# Patient Record
Sex: Female | Born: 1975 | Race: White | Hispanic: No | Marital: Married | Smoking: Former smoker
Health system: Southern US, Community
[De-identification: ages and names within clinical notes are randomized; demographics above are authoritative.]

## PROBLEM LIST (undated history)

## (undated) DIAGNOSIS — R29898 Other symptoms and signs involving the musculoskeletal system: Secondary | ICD-10-CM

## (undated) DIAGNOSIS — F319 Bipolar disorder, unspecified: Secondary | ICD-10-CM

## (undated) DIAGNOSIS — F32A Depression, unspecified: Secondary | ICD-10-CM

## (undated) HISTORY — DX: Bipolar disorder, unspecified: F31.9

## (undated) HISTORY — PX: OVARIAN CYST REMOVAL: SHX89

## (undated) HISTORY — PX: CHOLECYSTECTOMY: SHX55

## (undated) HISTORY — PX: GALLBLADDER SURGERY: SHX652

## (undated) HISTORY — DX: Depression, unspecified: F32.A

## (undated) HISTORY — PX: TUBAL LIGATION: SHX77

## (undated) HISTORY — PX: ABDOMINAL HYSTERECTOMY: SHX81

## (undated) HISTORY — PX: PARTIAL HYSTERECTOMY: SHX80

---

## 2005-04-24 ENCOUNTER — Ambulatory Visit: Payer: Self-pay | Admitting: Psychiatry

## 2005-04-24 ENCOUNTER — Inpatient Hospital Stay (HOSPITAL_COMMUNITY): Admission: EM | Admit: 2005-04-24 | Discharge: 2005-05-01 | Payer: Self-pay | Admitting: Psychiatry

## 2005-06-12 ENCOUNTER — Inpatient Hospital Stay (HOSPITAL_COMMUNITY): Admission: RE | Admit: 2005-06-12 | Discharge: 2005-06-16 | Payer: Self-pay | Admitting: Psychiatry

## 2005-08-21 HISTORY — PX: CARPAL TUNNEL RELEASE: SHX101

## 2008-05-19 ENCOUNTER — Ambulatory Visit (HOSPITAL_COMMUNITY): Payer: Self-pay | Admitting: Psychiatry

## 2008-06-02 ENCOUNTER — Ambulatory Visit (HOSPITAL_COMMUNITY): Payer: Self-pay | Admitting: Psychiatry

## 2011-01-06 NOTE — H&P (Signed)
NAMEJASMIA, [name]                   ACCOUNT NO.:  [id number]   MEDICAL RECORD NO.:  [id number]          PATIENT TYPE:  IPS   LOCATION:  0300                          FACILITY:  [facility]   PHYSICIAN:  Jeanice Lim, M.D. DATE OF BIRTH:  31-Dec-1975   DATE OF ADMISSION:  04/24/2005  DATE OF DISCHARGE:                         PSYCHIATRIC ADMISSION ASSESSMENT   IDENTIFYING INFORMATION:  A 35 year old separated white female,  involuntarily committed on April 24, 2005.   HISTORY OF PRESENT ILLNESS:  The patient is here for intentional overdose on  her pain pills.  She states they were not her medication.  She took 9 pills  in all.  The patient was driving in her car on Sunday feeling very depressed  and wanted it all to stop.  The patient went ahead and called the mental  health services.  She had gone to her husband's home, had seen her children  and then called the police.  She states she had been smoking crack cocaine  and marijuana, with her last use on Thursday prior to this admission.  She  has been sleeping only about 5 hours at night.  Appetite has been decreased  with no apparent weight loss.  She states she was also experiencing auditory  hallucinations the night prior to this admission.  The patient reports that  she is in the process of getting divorced and it was her wedding  anniversary.   PAST PSYCHIATRIC HISTORY:  First admission to [facility].  The  patient states she cut her wrist in March of 2006 and was in [place] for 24  hours, in an outpatient at [facility].   SOCIAL HISTORY:  She is a 35 year old separated white female, first  marriage, married for 12 years, has 4 children that are currently with the  patient's mother.  Her children are ages 64, 80, 68 and 21.  She is currently  living with her husband and is unemployed.   FAMILY HISTORY:  Grandfather had a nervous breakdown.   ALCOHOL DRUG HISTORY:  The patient smokes.  She denies  any alcohol use.  Has  been using crack cocaine and marijuana.   PAST MEDICAL HISTORY:  Primary care Kendarrius Tanzi is Dr. Sherril Croon in [place].  Medical  problems:  The patient reports none.   MEDICATIONS:  Has been on some Prevacid, taking Xanax 1/2 in the morning and  1 mg at bedtime and has been on Wellbutrin but has been off medicines for  the past couple of months.   DRUG ALLERGIES:  No known allergies.   PHYSICAL EXAMINATION:  The patient was assessed at [facility].  This  is an overweight female in no acute distress. Her temperature is 97.6, 70  heart rate, 22 respirations, blood pressure 128/70.  The patient also has  some healed scars to her left wrist and multiple tattoos.   LABORATORY DATA:  CBC is within normal limits.  Urine drug screen is  positive for THC, positive for cocaine, positive for benzodiazepines,  positive for opiates.  Urine pregnancy test was negative.  The  patient had a  normal EKG.  Her Tylenol level was less than 10.  Salicylate level was less  than 4.  Blood sugar is elevated at 127, potassium is 3.1.   MENTAL STATUS EXAM:  She is a fully alert female, cooperative, little eye  contact, casually dressed.  Speech is clear, normal pace and tone.  The  patient feels depressed.  The patient is somewhat flat.  Thought processes:  Endorsing positive auditory hallucinations, although does not appear to be  responding to internal stimuli at this time.  Suicidal thoughts.  Cognitive  function intact.  Memory is good, judgment is poor, insight is minimal.  Her  concentration is intact, average intelligence.   ADMISSION DIAGNOSES:  AXIS I:  Major depressive disorder, rule out bipolar  disorder not otherwise specified, polysubstance abuse.  AXIS II:  Deferred.  AXIS III:  None.  AXIS IV:  Problems with primary support group, other psychosocial problems.  AXIS V:  Current is 35, estimated this past year is 71.   PLAN:  Plan is to stabilize mood and thinking, work on  relapse prevention.  Will initiate the Librium protocol, monitor withdrawal symptoms.  The  patient is to increase coping skills.  Case manager is to look at any  potential rehab programs if the patient is agreeable.  We will initiate  Seroquel for anxiety, consider a mood stabilizer.  The patient is to follow  up with mental health and to be medication compliant.      Landry Corporal, N.P.      Jeanice Lim, M.D.  Electronically Signed    JO/MEDQ  D:  04/28/2005  T:  04/28/2005  Job:  [id number]
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
## 2011-01-06 NOTE — Discharge Summary (Signed)
NAMESHADDAI, SHAPLEY                   ACCOUNT NO.:  [id number]   MEDICAL RECORD NO.:  [id number]          PATIENT TYPE:  IPS   LOCATION:  0300                          FACILITY:  [facility]   PHYSICIAN:  Jeanice Lim, M.D. DATE OF BIRTH:  October 24, 1975   DATE OF ADMISSION:  04/24/2005  DATE OF DISCHARGE:  05/01/2005                                 DISCHARGE SUMMARY   IDENTIFYING DATA:  This is a 35 year old separated Caucasian female  involuntarily committed on April 24, 2005.  Here after intentional  overdose on pain pills.  States they were not her medication.  Took 9 pills  in all.  Was driving in her car on Sunday, feeling very depressed and wanted  to stop it all.  The patient went ahead and called mental health services  and gone to husband's home, seen her children and then called the police.  Had been smoking crack cocaine and marijuana.  Last use on Thursday prior to  this admission.  Had been sleeping only five hours a night.  Appetite  decreased.  Experiencing auditory hallucinations the night prior to  admission.  In the process of getting a divorce and it was her wedding  anniversary.  First admission to [facility].  Had cut her wrist in March of 2006  and was in [place] for 24 hours and an outpatient at [facility].  Denied alcohol use.  Reported using crack cocaine and  marijuana.   MEDICATIONS:  Prevacid, Xanax, Wellbutrin.  Had been off of medicines for a  couple of months.   ALLERGIES:  No known drug allergies.   PHYSICAL EXAMINATION:  Physical and neurologic exam within normal limits.   LABORATORY DATA:  Urine drug screen was positive for THC, cocaine,  benzodiazepines and opiates.  Urine pregnancy test negative.  EKG normal.  Blood sugar 127, potassium slightly low at 3.1.   MENTAL STATUS EXAM:  Fully alert female.  Cooperative.  Little eye contact.  Casually dressed.  Speech within normal limits.  Mood depressed.  Affect  flat.  Thought  processes endorsed positive auditory hallucinations.  Does  not appear to be responding to internal stimulus.  Fleeting suicidal  thoughts.  Cognitively intact.  Judgment and insight were poor to minimal.   ADMISSION DIAGNOSES:  AXIS I:  Major depressive disorder.  Rule out bipolar  disorder not otherwise specified.  Polysubstance abuse.  Possible substance-  induced mood disorder superimposed on bipolar disorder.  AXIS II:  Deferred.  AXIS III:  None.  AXIS IV:  Severe (problems with primary support group, other psychosocial  issues, ending of marriage, questionable legal issues, financial issues and  limited support system).  AXIS V:  35/60.   HOSPITAL COURSE:  The patient was admitted and ordered routine p.r.n.  medications and underwent further monitoring.  Was encouraged to participate  in individual, group and milieu therapy.  Was placed on Librium detox  protocol.  Was instructed on working on developing healthier coping skills.  Casemanager assists in trying to identify appropriate substance abuse  aftercare treatment including  considering residential substance abuse  programs and patient was started on Seroquel for mood instability, Celexa  for depression, Ambien for sleep and Risperdal for mood stabilization and  slight suspiciousness.  Seroquel was decreased.  Twenty-day program was  pursued and patient reported some difficulty sleeping but gradual  stabilization of mood.   CONDITION ON DISCHARGE:  Discharged in improved condition with no dangerous  ideation.  Mood was euthymic.  Judgment and insight improved.  The patient  reported motivation to remain abstinent and be compliant with the aftercare  plan.  Medication education was again reviewed.   DISCHARGE MEDICATIONS:  1.  Prevacid 30 mg, resume taking as previously prescribed.  2.  Symmetrel 100 mg b.i.d.  3.  Ambien 10 mg q.h.s. p.r.n. insomnia.  4.  Seroquel 100 mg, 1-1/2 q.h.s. and 25 mg, 3 tabs three times a  day.  5.  Celexa 1-1/2 q.a.m.  6.  Risperdal 1 mg, 1-1/2 q.h.s.  7.  Neurontin 300 mg, 1 at 9 a.m., 1 p.m. and 2 q.h.s.  8.  Claritin 10 mg q.d. p.r.n. allergies.   FOLLOW UP:  The patient was to follow up with Electa Sniff at [facility] on Thursday, May 04, 2005 at 9 p.m.   DISCHARGE DIAGNOSES:  AXIS I:  Major depressive disorder.  Rule out bipolar  disorder not otherwise specified.  Polysubstance abuse.  Possible substance-  induced mood disorder superimposed on bipolar disorder.  AXIS II:  Deferred.  AXIS III:  None.  AXIS IV:  Severe (problems with primary support group, other psychosocial  issues, ending of marriage, questionable legal issues, financial issues and  limited support system).  AXIS V:  GAF on discharge 55.      Jeanice Lim, M.D.  Electronically Signed     JEM/MEDQ  D:  06/11/2005  T:  06/11/2005  Job:  [id number]

## 2019-11-03 ENCOUNTER — Other Ambulatory Visit (HOSPITAL_COMMUNITY): Payer: Self-pay | Admitting: *Deleted

## 2019-11-03 DIAGNOSIS — Z1231 Encounter for screening mammogram for malignant neoplasm of breast: Secondary | ICD-10-CM

## 2019-11-10 ENCOUNTER — Ambulatory Visit (HOSPITAL_COMMUNITY)
Admission: RE | Admit: 2019-11-10 | Discharge: 2019-11-10 | Disposition: A | Discharge status: Home / Self Care | Payer: Self-pay | Source: Ambulatory Visit | Attending: *Deleted | Admitting: *Deleted

## 2019-11-10 ENCOUNTER — Other Ambulatory Visit: Payer: Self-pay

## 2019-11-10 DIAGNOSIS — Z1231 Encounter for screening mammogram for malignant neoplasm of breast: Secondary | ICD-10-CM | POA: Insufficient documentation

## 2021-05-19 ENCOUNTER — Telehealth: Payer: Self-pay

## 2021-05-19 NOTE — Telephone Encounter (Signed)
Follow up call with patient per completion of [facility] enrollment and eligibility on 9.27.22. to assist with getting first appointment scheduled with her requested PCP with [facility]     Pt was unavailable at time of call will be attempted later in the week, unless pt returns call first.     Text message reminder also sent reminding pt to schedule appointment as soon as possible.

## 2021-05-19 NOTE — Telephone Encounter (Signed)
Pt f/u with [facility]/Clara Gunn on today to get their first medical appointment scheduled with the [facility] per completing  [facility] enrollment and eligibility (05/17/2021)     Chief Medical Complaint(s): Aggravating sensation traveling from elbow to fingertips with feeling of little to no strength to pick up things  Wants to Re-establish medications and care with PCP due to hx of  (Asthma has inhaler currently  Hx of anxiety and depression with past hx of taking meds, but states although she is currently going through some sadness, she states she could use someone to talk to, but does not want to go back on medications  Plan  First appt has been scheduled for (Tues) May 24, 2021 @ 11:00 am  Pt was offered to be connected with a therapist referral by way of our BSW Intern with [employer] [facility].  Pt accepted referral and will be contacted by the week of May 23 2021  Information was reveiwed regarding "arrival time, bringing meds if applicable and cost ($90) one time donation fee for first visit, and cancellation/rescheduling appt policy.

## 2021-05-24 ENCOUNTER — Encounter: Payer: Self-pay | Admitting: Physician Assistant

## 2021-05-24 ENCOUNTER — Ambulatory Visit: Payer: Medicaid Other | Admitting: Physician Assistant

## 2021-05-24 VITALS — BP 119/99 | HR 90 | Temp 97.3°F | Wt 233.0 lb

## 2021-05-24 DIAGNOSIS — Z1239 Encounter for other screening for malignant neoplasm of breast: Secondary | ICD-10-CM

## 2021-05-24 DIAGNOSIS — E669 Obesity, unspecified: Secondary | ICD-10-CM

## 2021-05-24 DIAGNOSIS — Z131 Encounter for screening for diabetes mellitus: Secondary | ICD-10-CM

## 2021-05-24 DIAGNOSIS — R002 Palpitations: Secondary | ICD-10-CM

## 2021-05-24 DIAGNOSIS — Z13220 Encounter for screening for lipoid disorders: Secondary | ICD-10-CM

## 2021-05-24 DIAGNOSIS — M79641 Pain in right hand: Secondary | ICD-10-CM

## 2021-05-24 DIAGNOSIS — F32A Depression, unspecified: Secondary | ICD-10-CM

## 2021-05-24 DIAGNOSIS — R32 Unspecified urinary incontinence: Secondary | ICD-10-CM

## 2021-05-24 DIAGNOSIS — M25522 Pain in left elbow: Secondary | ICD-10-CM

## 2021-05-24 DIAGNOSIS — Z1211 Encounter for screening for malignant neoplasm of colon: Secondary | ICD-10-CM

## 2021-05-24 DIAGNOSIS — Z8249 Family history of ischemic heart disease and other diseases of the circulatory system: Secondary | ICD-10-CM

## 2021-05-24 DIAGNOSIS — Z7689 Persons encountering health services in other specified circumstances: Secondary | ICD-10-CM

## 2021-05-24 NOTE — Patient Instructions (Signed)
Tennis Elbow Tennis elbow (lateral epicondylitis) is inflammation of tendons in your outer forearm, near your elbow. Tendons are tissues that connect muscle to bone. When you have tennis elbow, inflammation affects the tendons that you use to bend your wrist and move your hand up. Inflammation occurs in the lower part of the upper arm bone (humerus), where the tendons connect to the bone (lateral epicondyle). Tennis elbow often affects people who play tennis, but anyone may get the condition from repeatedly extending the wrist or turning the forearm. What are the causes? This condition is usually caused by repeatedly extending the wrist, turning the forearm, and using the hands. It can result from sports or work that requires repetitive forearm movements. In some cases, it may be caused by a sudden injury. What increases the risk? You are more likely to develop tennis elbow if you play tennis or another racket sport. You also have a higher risk if you frequently use your hands for work. Besides people who play tennis, others at greater risk include: People who use computers. Architect workers. People who work in [employer]. Musicians. Cooks. Cashiers. What are the signs or symptoms? Symptoms of this condition include: Pain and tenderness in the forearm and the outer part of the elbow. Pain may be felt only when using the arm, or it may be there all the time. A burning feeling that starts in the elbow and spreads down the forearm. A weak grip in the hand. How is this diagnosed? This condition is diagnosed based on your symptoms, your medical history, and a physical exam. You may also have X-rays or an MRI to: Confirm the diagnosis. Look for other issues. Check for tears in the ligaments, muscles, or tendons. How is this treated? Resting and icing your arm is often the first treatment. Your health care provider may also recommend: Medicines to reduce pain and inflammation. These may be in  the form of a pill, topical gels, or shots of a steroid medicine (cortisone). An elbow strap to reduce stress on the area. Physical therapy. This may include massage or exercises or both. An elbow brace to restrict the movements that cause symptoms. If these treatments do not help relieve your symptoms, your health care provider may recommend surgery to remove damaged muscle and reattach healthy muscle to bone. Follow these instructions at home: If you have a brace or strap: Wear the brace or strap as told by your health care provider. Remove it only as told by your health care provider. Check the skin around the brace or strap every day. Tell your health care provider about any concerns. Loosen the brace if your fingers tingle, become numb, or turn cold and blue. Keep the brace clean. If the brace or strap is not waterproof: Do not let it get wet. Cover it with a watertight covering when you take a bath or a shower. Managing pain, stiffness, and swelling  If directed, put ice on the injured area. To do this: If you have a removable brace or strap, remove it as told by your health care provider. Put ice in a plastic bag. Place a towel between your skin and the bag. Leave the ice on for 20 minutes, 2-3 times a day. Remove the ice if your skin turns bright red. This is very important. If you cannot feel pain, heat, or cold, you have a greater risk of damage to the area. Move your fingers often to reduce stiffness and swelling. Activity Rest your elbow and  wrist and avoid activities that cause symptoms as told by your health care provider. Do physical therapy exercises as told by your health care provider. If you lift an object, lift it with your palm facing up. This reduces stress on your elbow. Lifestyle If your tennis elbow is caused by sports, check your equipment and make sure that: You use it correctly. It is good match for you. If your tennis elbow is caused by work or computer  use, take frequent breaks to stretch your arm. Talk with your employer about ways to manage your condition at work. General instructions Take over-the-counter and prescription medicines only as told by your health care provider. Do not use any products that contain nicotine or tobacco. These products include cigarettes, chewing tobacco, and vaping devices, such as e-cigarettes. If you need help quitting, ask your health care provider. Keep all follow-up visits. This is important. How is this prevented? Before and after activity: Warm up and stretch before being active. Cool down and stretch after being active. Give your body time to rest between periods of activity. During activity: Make sure to use equipment that fits you. If you play tennis, put power in your stroke with your lower body. Avoid using your arm only. Maintain physical fitness, including: Strength. Flexibility. Endurance. Do exercises to strengthen the forearm muscles. Contact a health care provider if: You have pain that gets worse or does not get better with treatment. You have numbness or weakness in your forearm, hand, or fingers. Get help right away if: Your pain is severe. You cannot move your wrist. Summary Tennis elbow (lateral epicondylitis) is inflammation of tendons in your outer forearm, near your elbow. Common symptoms include pain and tenderness in your forearm and the outer part of your elbow. This condition is usually caused by repeatedly extending your wrist, turning your forearm, and using your hands. The first treatment is often resting and icing your arm to relieve symptoms. Further treatment may include taking medicine, getting physical therapy, wearing a brace or strap, or having surgery. This information is not intended to replace advice given to you by your health care provider. Make sure you discuss any questions you have with your health care provider. Document Revised: 02/17/2020 Document  Reviewed: 02/17/2020 Elsevier Patient Education  [facility].

## 2021-05-24 NOTE — Progress Notes (Signed)
BP (!) 119/99   Pulse 90   Temp (!) 97.3 F (36.3 C)   Wt 233 lb (105.7 kg)   SpO2 99%    Subjective:    Patient ID: [name], female    DOB: 02-13-1976, 45 y.o.   MRN: [id number]  HPI: [name] is a 45 y.o. female presenting on 05/24/2021 for New Patient (Initial Visit)   HPI  Pt had a negative covid 19 screening questionnaire.  Pt is 14yoF who presents to establish care.    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  She says she was last seen there last year.  She Was taking it for incontenence.  It helps if she takes it multiple times/day but not if she takes it qd and she only remembers to take it qd.    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      She is married and has 4 sons,  ages 16- 84.  She tries to not have depression; she says she doesn't have tme for that.   She has No SI, HI.  She is still doing regular daily activities.    She is having left arm pain.  She thinks it's the carpal tunnel.  her pain is mostly in elbow.  Was supposed to have it carpal tunnel release but didn't get to the left side (she had the right done).      She is right hand dominant.    She doesn't remember the orthopedist that did her carpal tunnel release but says it was in [place].   Last mammogram- 10/2019.    She reports having a "Gurgle in heart and throat"  about 2 week ago- it only lasted just a second or two.  And she said it was Like her heart fluttered      Relevant past medical, surgical, family and social history reviewed and updated as indicated. Interim medical history since our last visit reviewed. Allergies and medications reviewed and updated.   Current Outpatient Medications:    acetaminophen (TYLENOL) 650 MG CR tablet, Take by mouth every 8 (eight) hours as needed  for pain., Disp: , Rfl:    naproxen sodium (ANAPROX) 275 MG tablet, Take 275 mg by mouth 2 (two) times daily with a meal., Disp: , Rfl:    oxybutynin (DITROPAN) 5 MG tablet, Take 5 mg by mouth 3 (three) times daily., Disp: , Rfl:     Review of Systems  Per HPI unless specifically indicated above     Objective:    BP (!) 119/99   Pulse 90   Temp (!) 97.3 F (36.3 C)   Wt 233 lb (105.7 kg)   SpO2 99%   Wt Readings from Last 3 Encounters:  05/24/21 233 lb (105.7 kg)    Physical Exam Vitals reviewed.  Constitutional:      General: She is not in acute distress.    Appearance: She is well-developed. She is obese. She is not ill-appearing.  HENT:     Head: Normocephalic and atraumatic.     Right Ear: Tympanic membrane, ear canal and external ear normal.     Left Ear: Tympanic membrane, ear canal and external ear normal.  Eyes:     Extraocular Movements: Extraocular movements intact.     Conjunctiva/sclera: Conjunctivae normal.  Pupils: Pupils are equal, round, and reactive to light.  Neck:     Thyroid: No thyromegaly.  Cardiovascular:     Rate and Rhythm: Normal rate and regular rhythm.  Pulmonary:     Effort: Pulmonary effort is normal.     Breath sounds: Normal breath sounds.  Abdominal:     General: Bowel sounds are normal.     Palpations: Abdomen is soft. There is no mass.     Tenderness: There is no abdominal tenderness.  Musculoskeletal:     Left elbow: No swelling. Normal range of motion. Tenderness present in lateral epicondyle.     Left forearm: Normal.     Left wrist: No swelling or tenderness. Normal range of motion. Normal pulse.     Right hand: Tenderness present. No bony tenderness. Normal range of motion. Normal pulse.     Cervical back: Neck supple.     Right lower leg: No edema.     Left lower leg: No edema.     Comments: Negative Tinel and Phalen on the left Tenderness right hand, palmar surface over 3rd mcp  Lymphadenopathy:     Cervical: No  cervical adenopathy.  Skin:    General: Skin is warm and dry.  Neurological:     Mental Status: She is alert and oriented to person, place, and time.     Motor: No weakness or tremor.     Gait: Gait normal.     Deep Tendon Reflexes:     Reflex Scores:      Patellar reflexes are 2+ on the right side and 2+ on the left side. Psychiatric:        Attention and Perception: Attention normal.        Behavior: Behavior is cooperative.    EKG- NSR without st-t changes.  No previous for comparison   No results found for this or any previous visit.      Assessment & Plan:   Encounter Diagnoses  Name Primary?   Encounter to establish care Yes   Screening cholesterol level    Screening for diabetes mellitus    Encounter for screening for malignant neoplasm of breast, unspecified screening modality    Screening for colon cancer    Family history of early CAD    Depression, unspecified depression type    Left elbow pain    Right hand pain    Obesity, unspecified classification, unspecified obesity type, unspecified whether serious comorbidity present    Fluttering sensation of heart    Urinary incontinence, unspecified type      HCM: -refer for screening mammogram -pt ws given FIT test for colon cancer screening -will get Baseline labs with UA -pt is educated and encouraged to get covid and influenza vaccinations.  She was offered appointment to get these but declined  Elbow / hand pain -will Refer to orthopedics for the left elbow and right hand.  She is encouraged to use ice to the elbow for 10-20 minutes, 3 or 4 times daily.  She has a elbow band.    Discussed that her left elbow pain may be due to epicondlyitis and not carpal tunnel as she has been suspecting.  She is given applicatio for [employer] financial assistance  MH -pt encouraged to go to [facility] for counseling  Incontinence - will check UA prior to making any medication changes   Pt will follow up 1 month  with virtual appointment.  She is to contact office sooner prn

## 2021-05-25 ENCOUNTER — Telehealth: Payer: Self-pay

## 2021-05-25 ENCOUNTER — Encounter: Payer: Self-pay | Admitting: Physician Assistant

## 2021-05-25 ENCOUNTER — Other Ambulatory Visit: Payer: Self-pay | Admitting: Physician Assistant

## 2021-05-25 NOTE — Telephone Encounter (Signed)
Called to follow up with [facility] client after her [facility] appointment.  She states all went well and she is being referred to orthopedist ( see referral in [place])  Discussed orders by provider for labs to be drawn at [facility] and that those are to be fasting. Client reports understanding.  Discussed also provider's recommendation for counseling. She states provider gave her a card about [facility]. Discussed with client regarding [facility]'s walk in intake days and times as well as location in [place] on [address].  Client reports understanding.  [facility] [employer]: client reports she thinks she has already filled that out, will ask [place] or D. Boyd with care connect to check and follow up if needed .   Discussed that [place] now has Social work Health visitor and received verbal permission for them to follow up by phone to assure connection with counseling and other possible needs. Client agreeable.  [place] [employer]

## 2021-05-31 ENCOUNTER — Other Ambulatory Visit: Payer: Self-pay

## 2021-05-31 ENCOUNTER — Other Ambulatory Visit (HOSPITAL_COMMUNITY)
Admission: RE | Admit: 2021-05-31 | Discharge: 2021-05-31 | Disposition: A | Discharge status: Home / Self Care | Payer: Medicaid Other | Source: Ambulatory Visit | Attending: Physician Assistant | Admitting: Physician Assistant

## 2021-05-31 ENCOUNTER — Other Ambulatory Visit (HOSPITAL_COMMUNITY): Payer: Self-pay | Admitting: Physician Assistant

## 2021-05-31 DIAGNOSIS — Z1231 Encounter for screening mammogram for malignant neoplasm of breast: Secondary | ICD-10-CM

## 2021-05-31 DIAGNOSIS — Z13220 Encounter for screening for lipoid disorders: Secondary | ICD-10-CM | POA: Insufficient documentation

## 2021-05-31 DIAGNOSIS — E669 Obesity, unspecified: Secondary | ICD-10-CM | POA: Insufficient documentation

## 2021-05-31 DIAGNOSIS — Z131 Encounter for screening for diabetes mellitus: Secondary | ICD-10-CM | POA: Insufficient documentation

## 2021-05-31 DIAGNOSIS — F32A Depression, unspecified: Secondary | ICD-10-CM | POA: Insufficient documentation

## 2021-05-31 DIAGNOSIS — R32 Unspecified urinary incontinence: Secondary | ICD-10-CM | POA: Insufficient documentation

## 2021-05-31 LAB — URINALYSIS, DIPSTICK ONLY
Bilirubin Urine: NEGATIVE
Glucose, UA: NEGATIVE mg/dL
Ketones, ur: NEGATIVE mg/dL
Leukocytes,Ua: NEGATIVE
Nitrite: NEGATIVE
Protein, ur: NEGATIVE mg/dL
Specific Gravity, Urine: 1.021 (ref 1.005–1.030)
pH: 5 (ref 5.0–8.0)

## 2021-05-31 LAB — COMPREHENSIVE METABOLIC PANEL
ALT: 28 U/L (ref 0–44)
AST: 16 U/L (ref 15–41)
Albumin: 4 g/dL (ref 3.5–5.0)
Alkaline Phosphatase: 62 U/L (ref 38–126)
Anion gap: 5 (ref 5–15)
BUN: 17 mg/dL (ref 6–20)
CO2: 24 mmol/L (ref 22–32)
Calcium: 8.3 mg/dL — ABNORMAL LOW (ref 8.9–10.3)
Chloride: 107 mmol/L (ref 98–111)
Creatinine, Ser: 0.58 mg/dL (ref 0.44–1.00)
GFR, Estimated: >60 mL/min (ref >60)
Glucose, Bld: 105 mg/dL — ABNORMAL HIGH (ref 70–99)
Potassium: 4.2 mmol/L (ref 3.5–5.1)
Sodium: 136 mmol/L (ref 135–145)
Total Bilirubin: 0.4 mg/dL (ref 0.3–1.2)
Total Protein: 6.3 g/dL — ABNORMAL LOW (ref 6.5–8.1)

## 2021-05-31 LAB — TSH: TSH: 1.923 u[IU]/mL (ref 0.350–4.500)

## 2021-05-31 LAB — LIPID PANEL
Cholesterol: 223 mg/dL — ABNORMAL HIGH (ref 0–200)
HDL: 38 mg/dL — ABNORMAL LOW (ref >40)
LDL Cholesterol: 139 mg/dL — ABNORMAL HIGH (ref 0–99)
Total CHOL/HDL Ratio: 5.9 RATIO
Triglycerides: 232 mg/dL — ABNORMAL HIGH (ref <150)
VLDL: 46 mg/dL — ABNORMAL HIGH (ref 0–40)

## 2021-05-31 LAB — HEMOGLOBIN A1C
Hgb A1c MFr Bld: 5.2 % (ref 4.8–5.6)
Mean Plasma Glucose: 102.54 mg/dL

## 2021-06-10 ENCOUNTER — Ambulatory Visit (HOSPITAL_COMMUNITY): Payer: Self-pay

## 2021-06-13 ENCOUNTER — Ambulatory Visit (HOSPITAL_COMMUNITY): Payer: Self-pay

## 2021-06-15 ENCOUNTER — Other Ambulatory Visit (HOSPITAL_COMMUNITY): Payer: Self-pay | Admitting: Physician Assistant

## 2021-06-15 ENCOUNTER — Ambulatory Visit (HOSPITAL_COMMUNITY)
Admission: RE | Admit: 2021-06-15 | Discharge: 2021-06-15 | Disposition: A | Discharge status: Home / Self Care | Payer: Self-pay | Source: Ambulatory Visit | Attending: Physician Assistant | Admitting: Physician Assistant

## 2021-06-15 ENCOUNTER — Encounter (HOSPITAL_COMMUNITY): Payer: Self-pay

## 2021-06-15 ENCOUNTER — Other Ambulatory Visit: Payer: Self-pay

## 2021-06-15 DIAGNOSIS — N6342 Unspecified lump in left breast, subareolar: Secondary | ICD-10-CM

## 2021-06-15 DIAGNOSIS — Z1231 Encounter for screening mammogram for malignant neoplasm of breast: Secondary | ICD-10-CM | POA: Insufficient documentation

## 2021-06-16 ENCOUNTER — Ambulatory Visit: Payer: Medicaid Other | Admitting: Physician Assistant

## 2021-06-16 ENCOUNTER — Encounter: Payer: Self-pay | Admitting: Physician Assistant

## 2021-06-16 DIAGNOSIS — F32A Depression, unspecified: Secondary | ICD-10-CM

## 2021-06-16 DIAGNOSIS — N63 Unspecified lump in unspecified breast: Secondary | ICD-10-CM

## 2021-06-16 DIAGNOSIS — N3281 Overactive bladder: Secondary | ICD-10-CM

## 2021-06-16 DIAGNOSIS — E785 Hyperlipidemia, unspecified: Secondary | ICD-10-CM

## 2021-06-16 MED ORDER — FESOTERODINE FUMARATE ER 4 MG PO TB24: 4.0000 mg | ORAL_TABLET | Freq: Every day | ORAL | 1 refills | Status: DC

## 2021-06-16 NOTE — Progress Notes (Signed)
There were no vitals taken for this visit.   Subjective:    Patient ID: [name], female    DOB: [birth year], 45 y.o.   MRN: [id number]  HPI: [name] is a 45 y.o. female presenting on 06/16/2021 for No chief complaint on file.   HPI  This is a telemedicine appointment through [employer]  I connected with  [name] on 06/16/21 by a video enabled telemedicine application and verified that I am speaking with the correct person using two identifiers.   I discussed the limitations of evaluation and management by telemedicine. The patient expressed understanding and agreed to proceed.  Pt is at home.  Provider is at office.    Pt is 23yoF who presents to follow up for new patient appointment earlier this month.  Pt went for mammogram yesterday but told them she had a lump so they wouldn't do the screening mammogram.  She says she had a bump that busted and drained and went away on the skin but left a left knot underneath the skiin.  She says it's like a Marble x 1 week that isn't going away.  Pt called office late yesterday reporting problems with Room is spinning violently and feeling like she was going to vomit.  She says it Happened 2 nights in a row.  She says it started after starting voltaren gel.  She didn't use the gel yesterday and she didn't have the spinning last night.  She is feeling well today.  Pt is s/p hysterectomy.  UA shows small blood.  She used to take ditropan for her incontinence but had troube remembering to take the medication 4 times every day .  She has been having the incontenence for a long long time, maybe ten years.   It started after childbirth- she has 4 children.  She was 15yo with her first.        Relevant past medical, surgical, family and social history reviewed and updated as indicated. Interim medical history since our last visit reviewed. Allergies and medications reviewed and updated.   Current Outpatient Medications:     naproxen sodium (ANAPROX) 275 MG tablet, Take 275 mg by mouth 2 (two) times daily with a meal., Disp: , Rfl:    acetaminophen (TYLENOL) 650 MG CR tablet, Take by mouth every 8 (eight) hours as needed for pain. (Patient not taking: Reported on 06/16/2021), Disp: , Rfl:    oxybutynin (DITROPAN) 5 MG tablet, Take 5 mg by mouth 3 (three) times daily. (Patient not taking: Reported on 06/16/2021), Disp: , Rfl:      Review of Systems  Per HPI unless specifically indicated above     Objective:    There were no vitals taken for this visit.  Wt Readings from Last 3 Encounters:  05/24/21 233 lb (105.7 kg)    Physical Exam Constitutional:      General: She is not in acute distress.    Appearance: She is not toxic-appearing.  HENT:     Head: Normocephalic and atraumatic.  Pulmonary:     Effort: Pulmonary effort is normal. No respiratory distress.     Comments: Pt is speaking in complete sentences without dyspnea. Neurological:     Mental Status: She is alert and oriented to person, place, and time.  Psychiatric:        Attention and Perception: Attention normal.        Speech: Speech normal.    Results for orders placed or performed during  the hospital encounter of 05/31/21  TSH  Result Value Ref Range   TSH 1.923 0.350 - 4.500 uIU/mL  Hemoglobin A1c  Result Value Ref Range   Hgb A1c MFr Bld 5.2 4.8 - 5.6 %   Mean Plasma Glucose 102.54 mg/dL  Lipid panel  Result Value Ref Range   Cholesterol 223 (H) 0 - 200 mg/dL   Triglycerides 232 (H) <150 mg/dL   HDL 38 (L) >40 mg/dL   Total CHOL/HDL Ratio 5.9 RATIO   VLDL 46 (H) 0 - 40 mg/dL   LDL Cholesterol 139 (H) 0 - 99 mg/dL  Comprehensive metabolic panel  Result Value Ref Range   Sodium 136 135 - 145 mmol/L   Potassium 4.2 3.5 - 5.1 mmol/L   Chloride 107 98 - 111 mmol/L   CO2 24 22 - 32 mmol/L   Glucose, Bld 105 (H) 70 - 99 mg/dL   BUN 17 6 - 20 mg/dL   Creatinine, Ser 0.58 0.44 - 1.00 mg/dL   Calcium 8.3 (L) 8.9 - 10.3  mg/dL   Total Protein 6.3 (L) 6.5 - 8.1 g/dL   Albumin 4.0 3.5 - 5.0 g/dL   AST 16 15 - 41 U/L   ALT 28 0 - 44 U/L   Alkaline Phosphatase 62 38 - 126 U/L   Total Bilirubin 0.4 0.3 - 1.2 mg/dL   GFR, Estimated >60 >60 mL/min   Anion gap 5 5 - 15  Urinalysis, dipstick only  Result Value Ref Range   Color, Urine YELLOW YELLOW   APPearance HAZY (A) CLEAR   Specific Gravity, Urine 1.021 1.005 - 1.030   pH 5.0 5.0 - 8.0   Glucose, UA NEGATIVE NEGATIVE mg/dL   Hgb urine dipstick SMALL (A) NEGATIVE   Bilirubin Urine NEGATIVE NEGATIVE   Ketones, ur NEGATIVE NEGATIVE mg/dL   Protein, ur NEGATIVE NEGATIVE mg/dL   Nitrite NEGATIVE NEGATIVE   Leukocytes,Ua NEGATIVE NEGATIVE      Assessment & Plan:    Encounter Diagnoses  Name Primary?   OAB (overactive bladder) Yes   Mass of breast, unspecified laterality    Hyperlipidemia, unspecified hyperlipidemia type    Depression, unspecified depression type      -reviewed labs with pt -Reordered mammogram as diagnostic -counseled pt on Lowfat diet for dyslipidemia.  Mailed reading information to her -trial of Toviaz for OAB -discussed Counseling and pt says she would be interested.  Will have counselor contact her -educated and encouraged pt to get Covid vaccination -pt will continue to avoid voltaren gel due to her getting to feeling badly when using it -pt to follow up 3 months.  She is to contact office sooner prn

## 2021-06-16 NOTE — Patient Instructions (Signed)

## 2021-06-21 ENCOUNTER — Ambulatory Visit: Payer: Medicaid Other | Admitting: Physician Assistant

## 2021-06-21 ENCOUNTER — Telehealth: Payer: Self-pay | Admitting: Licensed Clinical Social Worker

## 2021-06-21 NOTE — Telephone Encounter (Signed)
[facility] reached patient via phone call, patient confirmed interest in counseling services, first appointment was scheduled for 07/05/21 at 9 am.

## 2021-06-28 ENCOUNTER — Telehealth: Payer: Self-pay | Admitting: Licensed Clinical Social Worker

## 2021-06-28 NOTE — Telephone Encounter (Signed)
[facility] reached patient via phone call regarding rescheduling counseling appointment, patient asked [facility] to call again next week, so that she could have access to her schedule by then, [facility] agreed.

## 2021-07-05 ENCOUNTER — Ambulatory Visit: Payer: Medicaid Other | Admitting: Licensed Clinical Social Worker

## 2021-07-05 ENCOUNTER — Telehealth: Payer: Self-pay | Admitting: Licensed Clinical Social Worker

## 2021-07-05 NOTE — Telephone Encounter (Signed)
[facility] reached patient and first counseling appointment was scheduled for 9 am on 08/09/21.

## 2021-07-12 ENCOUNTER — Telehealth: Payer: Self-pay

## 2021-07-12 NOTE — Telephone Encounter (Signed)
Call from pt regarding rx from [facility] not received, informed I would call to check with pharmacy & pharmacy # given.  Called [facility] & was informed Rx not ordered & order now placed, medication to be received within the next 2 weeks.  Called pt back to inform of medication to be delivered, left vm to call back as there was no answer

## 2021-07-26 ENCOUNTER — Ambulatory Visit (HOSPITAL_COMMUNITY): Payer: Self-pay

## 2021-07-26 ENCOUNTER — Ambulatory Visit (HOSPITAL_COMMUNITY): Payer: Medicaid Other

## 2021-07-26 ENCOUNTER — Encounter (HOSPITAL_COMMUNITY): Payer: Self-pay

## 2021-07-26 ENCOUNTER — Encounter (HOSPITAL_COMMUNITY): Payer: Medicaid Other

## 2021-08-09 ENCOUNTER — Ambulatory Visit: Payer: Medicaid Other | Admitting: Licensed Clinical Social Worker

## 2021-08-09 ENCOUNTER — Telehealth: Payer: Self-pay | Admitting: Licensed Clinical Social Worker

## 2021-08-09 NOTE — Telephone Encounter (Signed)
[facility] attempted to reach patient to reschedule cancelled apt, left voicemail.

## 2021-08-29 ENCOUNTER — Encounter: Payer: Self-pay | Admitting: Orthopaedic Surgery

## 2021-09-07 ENCOUNTER — Other Ambulatory Visit: Payer: Self-pay | Admitting: Physician Assistant

## 2021-09-07 DIAGNOSIS — E785 Hyperlipidemia, unspecified: Secondary | ICD-10-CM

## 2021-09-16 ENCOUNTER — Other Ambulatory Visit (HOSPITAL_COMMUNITY)
Admission: RE | Admit: 2021-09-16 | Discharge: 2021-09-16 | Disposition: A | Discharge status: Home / Self Care | Payer: Medicaid Other | Source: Ambulatory Visit | Attending: Physician Assistant | Admitting: Physician Assistant

## 2021-09-16 ENCOUNTER — Other Ambulatory Visit: Payer: Self-pay

## 2021-09-16 DIAGNOSIS — E785 Hyperlipidemia, unspecified: Secondary | ICD-10-CM | POA: Insufficient documentation

## 2021-09-16 LAB — HEPATIC FUNCTION PANEL
ALT: 27 U/L (ref 0–44)
AST: 16 U/L (ref 15–41)
Albumin: 4.2 g/dL (ref 3.5–5.0)
Alkaline Phosphatase: 73 U/L (ref 38–126)
Bilirubin, Direct: <0.1 mg/dL (ref 0.0–0.2)
Total Bilirubin: 0.3 mg/dL (ref 0.3–1.2)
Total Protein: 7.1 g/dL (ref 6.5–8.1)

## 2021-09-16 LAB — LIPID PANEL
Cholesterol: 212 mg/dL — ABNORMAL HIGH (ref 0–200)
HDL: 39 mg/dL — ABNORMAL LOW (ref >40)
LDL Cholesterol: 128 mg/dL — ABNORMAL HIGH (ref 0–99)
Total CHOL/HDL Ratio: 5.4 RATIO
Triglycerides: 226 mg/dL — ABNORMAL HIGH (ref <150)
VLDL: 45 mg/dL — ABNORMAL HIGH (ref 0–40)

## 2021-09-20 ENCOUNTER — Ambulatory Visit: Payer: Medicaid Other | Admitting: Physician Assistant

## 2021-09-20 ENCOUNTER — Ambulatory Visit (HOSPITAL_COMMUNITY)
Admission: RE | Admit: 2021-09-20 | Discharge: 2021-09-20 | Disposition: A | Discharge status: Home / Self Care | Payer: Self-pay | Source: Ambulatory Visit | Attending: Physician Assistant | Admitting: Physician Assistant

## 2021-09-20 ENCOUNTER — Other Ambulatory Visit: Payer: Self-pay

## 2021-09-20 ENCOUNTER — Encounter: Payer: Self-pay | Admitting: Physician Assistant

## 2021-09-20 VITALS — BP 138/100 | HR 89 | Temp 97.8°F | Ht 61.75 in | Wt 236.0 lb

## 2021-09-20 DIAGNOSIS — N6342 Unspecified lump in left breast, subareolar: Secondary | ICD-10-CM | POA: Insufficient documentation

## 2021-09-20 DIAGNOSIS — N3281 Overactive bladder: Secondary | ICD-10-CM

## 2021-09-20 DIAGNOSIS — F32A Depression, unspecified: Secondary | ICD-10-CM

## 2021-09-20 DIAGNOSIS — E785 Hyperlipidemia, unspecified: Secondary | ICD-10-CM

## 2021-09-20 NOTE — Patient Instructions (Addendum)
COVID-19 vaccination significantly lowers your risk of severe illness, hospitalization, and death if you get infected. Compared to people who are up to date with their COVID-19 vaccinations, unvaccinated people aremore likely to get COVID-19, much more likely to be hospitalized with COVID-19, and much more likely to die from COVID-19. Like all vaccines, COVID-19 vaccines are not 100% effective at preventing infection. Some people who are up to date with their COVID-19 vaccinations will get COVID-19 breakthrough infection. However, staying up to date with your COVID-19 vaccinations means that you are less likely to have a breakthrough infection and, if you do get sick, you are less likely to get severely ill or die. Staying up to date with COVID-19 vaccination also means you are less likely to spread the disease to others and increases your protection against new variants of SARS-CoV-2, the virus that causes COVID-19.

## 2021-09-20 NOTE — Progress Notes (Signed)
BP (!) 138/100    Pulse 89    Temp 97.8 F (36.6 C)    Ht 5' 1.75" (1.568 m)    Wt 236 lb (107 kg)    SpO2 97%    BMI 43.52 kg/m    Subjective:    Patient ID: [name], female    DOB: July 05, 1976, 46 y.o.   MRN: [id number]  HPI: [name] is a 46 y.o. female presenting on 09/20/2021 for Hyperlipidemia and Over Active Bladder   HPI  Chief Complaint  Patient presents with   Hyperlipidemia   Over Active Bladder     Pt is 45yoF with appt today to recheck OAB after startting Toviaz at last appointment.  She is also here to follow up dyslipidemia; she opted to treat with lowfat diet.  She says she hasn't really history MH sub abuse and SI  Pt c/o memory problems and hip pain and sob  Pt was Referred to orthopedics but they closed referral due to they were unable to contact her.  She says the Charlann Boxer helps if she remembers to take it but she hates to take medicine.  She has not yet got covid vacciantion  [facility] is no [facility].  [facility] here called pt multiple times, leaving voicemails, but pt never called back.    Pt says She fell - hip and back hurting now.    She has appointment for Mammogram later today.  She has no SI, HI, or recent drug use  She has not returned FIT test given in October for colon cancer screening.     Relevant past medical, surgical, family and social history reviewed and updated as indicated. Interim medical history since our last visit reviewed. Allergies and medications reviewed and updated.   Current Outpatient Medications:    acetaminophen (TYLENOL) 650 MG CR tablet, Take by mouth every 8 (eight) hours as needed for pain., Disp: , Rfl:    fesoterodine (TOVIAZ) 4 MG TB24 tablet, Take 1 tablet (4 mg total) by mouth daily., Disp: 90 tablet, Rfl: 1   naproxen sodium (ANAPROX) 275 MG tablet, Take 275 mg by mouth 2 (two) times daily with a meal. (Patient not taking: Reported on 09/20/2021), Disp: , Rfl:     Review of  Systems  Per HPI unless specifically indicated above     Objective:    BP (!) 138/100    Pulse 89    Temp 97.8 F (36.6 C)    Ht 5' 1.75" (1.568 m)    Wt 236 lb (107 kg)    SpO2 97%    BMI 43.52 kg/m   Wt Readings from Last 3 Encounters:  09/20/21 236 lb (107 kg)  05/24/21 233 lb (105.7 kg)    Physical Exam Vitals reviewed.  Constitutional:      General: She is not in acute distress.    Appearance: She is well-developed. She is obese. She is not toxic-appearing.  HENT:     Head: Normocephalic and atraumatic.  Cardiovascular:     Rate and Rhythm: Normal rate and regular rhythm.  Pulmonary:     Effort: Pulmonary effort is normal.     Breath sounds: Normal breath sounds.  Abdominal:     General: Bowel sounds are normal.     Palpations: Abdomen is soft. There is no mass.     Tenderness: There is no abdominal tenderness.  Musculoskeletal:     Cervical back: Neck supple.     Right lower  leg: No edema.     Left lower leg: No edema.  Lymphadenopathy:     Cervical: No cervical adenopathy.  Skin:    General: Skin is warm and dry.  Neurological:     Mental Status: She is alert and oriented to person, place, and time.  Psychiatric:        Behavior: Behavior normal.    Results for orders placed or performed during the hospital encounter of 09/16/21  Hepatic function panel  Result Value Ref Range   Total Protein 7.1 6.5 - 8.1 g/dL   Albumin 4.2 3.5 - 5.0 g/dL   AST 16 15 - 41 U/L   ALT 27 0 - 44 U/L   Alkaline Phosphatase 73 38 - 126 U/L   Total Bilirubin 0.3 0.3 - 1.2 mg/dL   Bilirubin, Direct <0.1 0.0 - 0.2 mg/dL   Indirect Bilirubin NOT CALCULATED 0.3 - 0.9 mg/dL  Lipid panel  Result Value Ref Range   Cholesterol 212 (H) 0 - 200 mg/dL   Triglycerides 226 (H) <150 mg/dL   HDL 39 (L) >40 mg/dL   Total CHOL/HDL Ratio 5.4 RATIO   VLDL 45 (H) 0 - 40 mg/dL   LDL Cholesterol 128 (H) 0 - 99 mg/dL      Assessment & Plan:   Encounter Diagnoses  Name Primary?   OAB  (overactive bladder) Yes   Hyperlipidemia, unspecified hyperlipidemia type    Morbid obesity ([place])    Depression, unspecified depression type      -reviewed labs with pt -pt declined recommendation to take fish oil.  She is encouraged to Follow lowfat diet -pt was encouraged to reduce Weight with healthy diet and regular Exercise.  This will help her lipids and her joint pains and her OAB.  -pt was scheduled to see [facility] -educated and encouraged pt to get Covid vaccination -pt to [place].  Discussed with pt that like any mediations, it cannot work if she doesn't take it -pt was told to call orthopedics if she was still interested in going there Pt encouraged to Return FIT test given to her for colon cancer screening -pt to follow up 3 months.  She is to contact office sooner prn

## 2021-09-22 ENCOUNTER — Ambulatory Visit: Payer: Medicaid Other | Admitting: Physician Assistant

## 2021-09-22 ENCOUNTER — Encounter: Payer: Self-pay | Admitting: Physician Assistant

## 2021-09-22 ENCOUNTER — Other Ambulatory Visit: Payer: Self-pay

## 2021-09-22 VITALS — BP 144/90 | HR 91 | Temp 97.1°F | Wt 234.0 lb

## 2021-09-22 DIAGNOSIS — N6002 Solitary cyst of left breast: Secondary | ICD-10-CM

## 2021-09-22 MED ORDER — DICLOXACILLIN SODIUM 250 MG PO CAPS: 250.0000 mg | ORAL_CAPSULE | Freq: Four times a day (QID) | ORAL | 0 refills | Status: DC

## 2021-09-22 NOTE — Progress Notes (Signed)
° °  BP (!) 144/90    Pulse 91    Temp (!) 97.1 F (36.2 C)    Wt 234 lb (106.1 kg)    SpO2 99%    BMI 43.15 kg/m    Subjective:    Patient ID: [name], female    DOB: 1975-11-06, 46 y.o.   MRN: [id number]  HPI: [name] is a 46 y.o. female presenting on 09/22/2021 for Breast Mass   HPI   Chief Complaint  Patient presents with   Breast Mass    Pt had Diagnostic mammogram which showed her mass to be a cyst.  Pt reports it is, at times, red and painful.  She admits to squeezing it, trying to express pus.  There has been no drainage form the cyst of the nipple.     Relevant past medical, surgical, family and social history reviewed and updated as indicated. Interim medical history since our last visit reviewed. Allergies and medications reviewed and updated.  Review of Systems  Per HPI unless specifically indicated above     Objective:    BP (!) 144/90    Pulse 91    Temp (!) 97.1 F (36.2 C)    Wt 234 lb (106.1 kg)    SpO2 99%    BMI 43.15 kg/m   Wt Readings from Last 3 Encounters:  09/22/21 234 lb (106.1 kg)  09/20/21 236 lb (107 kg)  05/24/21 233 lb (105.7 kg)    Physical Exam Constitutional:      General: She is not in acute distress.    Appearance: She is obese. She is not ill-appearing.  Pulmonary:     Effort: Pulmonary effort is normal. No respiratory distress.  Chest:  Breasts:    Left: Mass present. No swelling, bleeding, inverted nipple, nipple discharge or tenderness.     Comments: Small, less than 1 cm, round soft mobile cyst at 9 o'clock position of the left breast.  There is no redness, heat or tenderness. Neurological:     Mental Status: She is alert and oriented to person, place, and time.  Psychiatric:        Attention and Perception: Attention normal.        Speech: Speech normal.        Behavior: Behavior normal. Behavior is cooperative.          Assessment & Plan:   Encounter Diagnosis  Name Primary?   Cyst of left  breast Yes     -Pt was reassured.  Will rx Diclox.  Pt is urged to STOP MASHING her breast.  She can use warm compresses as needed -f/u as scheduled.  RTO sooner for any worsening or changes

## 2021-09-22 NOTE — Patient Instructions (Signed)
Breast Cyst A breast cyst is a sac in the breast that is filled with fluid. They are usually noncancerous (benign) and are common among women. Breast cysts are most often in the upper, outer portion of the breast. One or more cysts may develop. They form when fluid builds up inside the breast glands. There are several types of breast cysts. Some are too small to feel, but these can be seen with imaging tests such as an X-ray of the breast (mammogram) or ultrasound. Breast cysts do not increase your risk of breast cancer. They usually disappear after you no longer have a menstrual cycle (after menopause), unless you take artificial hormones (are on hormone therapy). What are the causes? This condition may be caused by: Blockage of tubes (ducts) in the breast glands, which leads to fluid buildup. Duct blockage may result from: Fibrocystic breast changes. This is a common, benign condition that occurs when women go through hormonal changes during the menstrual cycle. This is a common cause of multiple breast cysts. Overgrowth of breast tissue or breast glands. Scar tissue in the breast from previous surgery. Changes in certain female hormones (estrogen and progesterone). The exact cause of this condition is not known. What increases the risk? You may be more likely to develop breast cysts if you have not gone through menopause. What are the signs or symptoms? Symptoms of this condition include: Feeling one or more smooth, round, soft lumps (like grapes) in the breast that are easily movable. The lump or lumps may get bigger and more painful before your menstrual period and get smaller after your menstrual period. Breast discomfort or pain. How is this diagnosed? This condition may be diagnosed based on: A physical exam. A cyst can be felt by your health care provider during this exam. Imaging tests, such as mammogram or ultrasound. Fluid may be removed from the cyst with a needle (fine-needle  aspiration) and tested to make sure the cyst is not cancerous. How is this treated? Treatment may not be needed for this condition. Your health care provider may monitor the cyst to see if it goes away on its own. If the cyst is uncomfortable or gets bigger, or if you do not like how the cyst makes your breast look, you may need treatment. Treatment may include: Hormone therapy. Fine-needle aspiration to drain fluid from the cyst. There is a chance of the cyst coming back (recurring) after aspiration. Surgery to remove the cyst. Follow these instructions at home: Self-exams  Do a breast self-exam every month, or as often as directed. A breast self-exam involves: Comparing your breasts in the mirror. Looking for visible changes in your skin or nipples. Feeling for lumps or changes. Having many breast cysts may make it harder to feel for new lumps. Understand how your breasts normally look and feel, and write down any changes in your breasts. Tell your health care provider about any changes. Eating and drinking Follow instructions from your health care provider about eating and drinking restrictions. Drink enough fluid to keep your urine pale yellow. Avoid caffeine. Cut down on salt (sodium) in what you eat and drink, especially before your menstrual period. Too much sodium can cause fluid buildup, breast swelling, and discomfort. General instructions See your health care provider regularly. Get a yearly physical exam. If you are 87-48 years old, get a clinical breast exam every 1-3 years. After the age of 61 years, get this exam every year. Get mammograms as often as directed. Take over-the-counter  and prescription medicines only as told by your health care provider. Wear a supportive bra, especially when exercising. Keep all follow-up visits as told by your health care provider. This is important. Contact a health care provider if: You feel, or think you feel, a lump in your breast. You  notice that both breasts look or feel different than usual. Your breast is still causing pain after your menstrual period is over. You find new lumps or bumps that were not there before. You feel lumps in your armpit. Get help right away if: You have severe pain, tenderness, redness, or warmth in your breast. You have fluid or blood leaking from your nipple. Your breast lump becomes hard and painful. You notice dimpling or wrinkling of the breast or nipple. Summary A breast cyst is a sac in the breast that is filled with fluid. Treatment may not be needed for this condition. If the cyst is uncomfortable or gets bigger, or if you do not like how the cyst makes your breast look, you may need treatment. This information is not intended to replace advice given to you by your health care provider. Make sure you discuss any questions you have with your health care provider. Document Revised: 12/24/2018 Document Reviewed: 12/24/2018 Elsevier Patient Education  [place].

## 2021-10-04 ENCOUNTER — Ambulatory Visit: Payer: Medicaid Other | Admitting: Licensed Clinical Social Worker

## 2021-10-04 ENCOUNTER — Other Ambulatory Visit: Payer: Self-pay | Admitting: Physician Assistant

## 2021-10-04 DIAGNOSIS — Z1211 Encounter for screening for malignant neoplasm of colon: Secondary | ICD-10-CM

## 2021-10-04 DIAGNOSIS — F32A Depression, unspecified: Secondary | ICD-10-CM

## 2021-10-04 DIAGNOSIS — F419 Anxiety disorder, unspecified: Secondary | ICD-10-CM

## 2021-10-04 LAB — IFOBT (OCCULT BLOOD): IFOBT: NEGATIVE

## 2021-10-04 NOTE — Progress Notes (Signed)
[facility] engaged patient in initial [facility] session. [facility] provided active listening and validation as patient shared about current symptoms of anxiety and depression, stressors, and past loss/trauma. No SI/HI. Patient's current support systems and coping mechanisms were assessed. Treatment will begin with biweekly sessions to address symptoms.

## 2021-10-12 ENCOUNTER — Telehealth: Payer: Self-pay

## 2021-10-18 ENCOUNTER — Ambulatory Visit: Payer: Medicaid Other | Admitting: Licensed Clinical Social Worker

## 2021-11-01 ENCOUNTER — Ambulatory Visit: Payer: Medicaid Other | Admitting: Licensed Clinical Social Worker

## 2021-12-19 ENCOUNTER — Ambulatory Visit: Payer: Medicaid Other | Admitting: Physician Assistant

## 2021-12-19 ENCOUNTER — Encounter: Payer: Self-pay | Admitting: Physician Assistant

## 2021-12-19 VITALS — BP 137/84 | HR 84 | Temp 97.1°F

## 2021-12-19 DIAGNOSIS — R32 Unspecified urinary incontinence: Secondary | ICD-10-CM

## 2021-12-19 DIAGNOSIS — K921 Melena: Secondary | ICD-10-CM

## 2021-12-19 DIAGNOSIS — F419 Anxiety disorder, unspecified: Secondary | ICD-10-CM

## 2021-12-19 DIAGNOSIS — G8929 Other chronic pain: Secondary | ICD-10-CM

## 2021-12-19 DIAGNOSIS — R200 Anesthesia of skin: Secondary | ICD-10-CM

## 2021-12-19 DIAGNOSIS — Z800 Family history of malignant neoplasm of digestive organs: Secondary | ICD-10-CM

## 2021-12-19 DIAGNOSIS — N6002 Solitary cyst of left breast: Secondary | ICD-10-CM

## 2021-12-19 DIAGNOSIS — N3281 Overactive bladder: Secondary | ICD-10-CM

## 2021-12-19 MED ORDER — OXYBUTYNIN CHLORIDE ER 10 MG PO TB24: 10.0000 mg | ORAL_TABLET | Freq: Every day | ORAL | 1 refills | Status: DC

## 2021-12-19 NOTE — Progress Notes (Signed)
? ?BP 137/84   Pulse 84   Temp (!) 97.1 ?F (36.2 ?C)   SpO2 98%   ? ?Subjective:  ? ? Patient ID: [name], female    DOB: May 17, 1976, 46 y.o.   MRN: [id number] ? ?HPI: ?[name] is a 46 y.o. female presenting on 12/19/2021 for No chief complaint on file. ? ? ?HPI ? ?Pt is 2yoF who presents for recheck OAB and recheck breast cyst. ? ?She Took toviaz since OV 09/20/21 and she was still leaking.   She she says she does not feel an urge, it just comes out.   Sometimes she does know that it is going to come out.   Oxybutynin worked in the past but she stopped it because she couldn't remember to take it 4 times/day.    She does not drink sodas.  She drinks water.  ? ?The breast cyst is still there but it doesn't hurt.   She has stopped mashing on it.   She says it is getting a lot smaller.  She has no nipple discharge.  Diagnostic mammogram was reassuring.  ? ?She has had a lot of blood in her stool.  She says it has been going on for years but never reported it.   It isn't every single time but it is over half the time.  She says it is a lot of blood at times.    She had negative FIT test 10/04/21.  She has history of hemorrhoids and has had to have them lanced in the past.  She has family history of colon cancer.  ? 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  She had same thiing in the right but had surgery on the right elbow about 13 years ago.  She says they wanted to operate on the left elbow as well but she didn't get it done.   In the past it was just from her elbow down but is now all the way up to the shoulder.   ? ?She applied recently for [employer] financial assistance.  She says she kept getting a bill for bloodwork. ? ? ? ?Relevant past medical, surgical, family and social history reviewed and updated as indicated. Interim medical history since our last visit reviewed. ?Allergies and medications reviewed and updated. ? ? ? ?Current Outpatient Medications:  ?  acetaminophen  (TYLENOL) 650 MG CR tablet, Take by mouth every 8 (eight) hours as needed for pain., Disp: , Rfl:  ?  NAPROXEN DR PO, Take by mouth., Disp: , Rfl:  ?  fesoterodine (TOVIAZ) 4 MG TB24 tablet, Take 1 tablet (4 mg total) by mouth daily. (Patient not taking: Reported on 12/19/2021), Disp: 90 tablet, Rfl: 1 ? ? ? ? ?Review of Systems ? ?Per HPI unless specifically indicated above ? ?   ?Objective:  ?  ?BP 137/84   Pulse 84   Temp (!) 97.1 ?F (36.2 ?C)   SpO2 98%   ?Wt Readings from Last 3 Encounters:  ?09/22/21 234 lb (106.1 kg)  ?09/20/21 236 lb (107 kg)  ?05/24/21 233 lb (105.7 kg)  ?  ?Physical Exam ?Vitals reviewed. Exam conducted with a chaperone present.  ?Constitutional:   ?   General: She is not in acute distress. ?   Appearance: She is well-developed. She is obese. She is not toxic-appearing.  ?HENT:  ?   Head: Normocephalic and atraumatic.  ?Cardiovascular:  ?   Rate and Rhythm: Normal rate and regular rhythm.  ?  Pulmonary:  ?   Effort: Pulmonary effort is normal.  ?   Breath sounds: Normal breath sounds.  ?Abdominal:  ?   General: Bowel sounds are normal.  ?   Palpations: Abdomen is soft. There is no mass.  ?   Tenderness: There is no abdominal tenderness.  ?Genitourinary: ?   Rectum: No mass, tenderness or external hemorrhoid.  ?   Comments: Extra skin/small non-thrombosed external hemorrhoids ?Musculoskeletal:  ?   Left shoulder: Tenderness present. No swelling or deformity. Normal range of motion. Normal strength.  ?   Left elbow: Normal.  ?   Left wrist: Normal.  ?   Cervical back: Neck supple.  ?   Right lower leg: No edema.  ?   Left lower leg: No edema.  ?   Comments: Left shoulder tenderness posteriorly at the acromion  ?Lymphadenopathy:  ?   Cervical: No cervical adenopathy.  ?Skin: ?   General: Skin is warm and dry.  ?Neurological:  ?   Mental Status: She is alert and oriented to person, place, and time.  ?Psychiatric:     ?   Behavior: Behavior normal.  ? ? ? ? ? ? ?   ?Assessment & Plan:   ? ?Encounter Diagnoses  ?Name Primary?  ? OAB (overactive bladder) Yes  ? Urinary incontinence, unspecified type   ? Blood present in stool   ? Family history of colon cancer   ? Cyst of left breast   ? Anxiety   ? Chronic left shoulder pain   ? Left arm numbness   ? ? ? ? ? ?OAB/ incontinence ?-Trial of ditropan xl for OAB and incontinence ? ?Blood PR/ family history colon cancer ?-Refer to GI for blood PR and family hx colon cancer.  Discussed that the bleeding may all be due to hemorrhoids but with frequency of the bleeding and family history, referral is indicated.  She agrees ? ?Breast cyst ?-Will continue to monitor breast cyst ? ?HCM ?-pt was Educated and encouraged to get covid vaccination ? ?MH  ?-pt was rescheduled with [facility] ? ? ?Left shoulder/arm ?Will plan to refer to orthopedics if cafa approved ? ? ?-will Check on her [employer] financial assistance application  ?-pt to follow up 4 weeks to recheck OAB.  She is to contact office sooner prn ? ?

## 2021-12-19 NOTE — Patient Instructions (Addendum)
Overactive Bladder, Adult ? ?Overactive bladder is a condition in which a person has a sudden and frequent need to urinate. A person might also leak urine if he or she cannot get to the bathroom fast enough (urinary incontinence). Sometimes, symptoms can interfere with work or social activities. ?What are the causes? ?Overactive bladder is associated with poor nerve signals between your bladder and your brain. Your bladder may get the signal to empty before it is full. You may also have very sensitive muscles that make your bladder squeeze too soon. This condition may also be caused by other factors, such as: ?Medical conditions: ?Urinary tract infection. ?Infection of nearby tissues. ?Prostate enlargement. ?Bladder stones, inflammation, or tumors. ?Diabetes. ?Muscle or nerve weakness, especially from these conditions: ?A spinal cord injury. ?Stroke. ?Multiple sclerosis. ?Parkinson's disease. ?Other causes: ?Surgery on the uterus or urethra. ?Drinking too much caffeine or alcohol. ?Certain medicines, especially those that eliminate extra fluid in the body (diuretics). ?Constipation. ?What increases the risk? ?You may be at greater risk for overactive bladder if you: ?Are an older adult. ?Smoke. ?Are going through menopause. ?Have prostate problems. ?Have a neurological disease, such as stroke, dementia, Parkinson's disease, or multiple sclerosis (MS). ?Eat or drink alcohol, spicy food, caffeine, and other things that irritate the bladder. ?Are overweight or obese. ?What are the signs or symptoms? ?Symptoms of this condition include a sudden, strong urge to urinate. Other symptoms include: ?Leaking urine. ?Urinating 8 or more times a day. ?Waking up to urinate 2 or more times overnight. ?How is this diagnosed? ?This condition may be diagnosed based on: ?Your symptoms and medical history. ?A physical exam. ?Blood or urine tests to check for possible causes, such as infection. ?You may also need to see a health care  provider who specializes in urinary tract problems. This is called a urologist. ?How is this treated? ?Treatment for overactive bladder depends on the cause of your condition and whether it is mild or severe. Treatment may include: ?Bladder training, such as: ?Learning to control the urge to urinate by following a schedule to urinate at regular intervals. ?Doing Kegel exercises to strengthen the pelvic floor muscles that support your bladder. ?Special devices, such as: ?Biofeedback. This uses sensors to help you become aware of your body's signals. ?Electrical stimulation. This uses electrodes placed inside the body (implanted) or outside the body. These electrodes send gentle pulses of electricity to strengthen the nerves or muscles that control the bladder. ?Women may use a plastic device, called a pessary, that fits into the vagina and supports the bladder. ?Medicines, such as: ?Antibiotics to treat bladder infection. ?Antispasmodics to stop the bladder from releasing urine at the wrong time. ?Tricyclic antidepressants to relax bladder muscles. ?Injections of botulinum toxin type A directly into the bladder tissue to relax bladder muscles. ?Surgery, such as: ?A device may be implanted to help manage the nerve signals that control urination. ?An electrode may be implanted to stimulate electrical signals in the bladder. ?A procedure may be done to change the shape of the bladder. This is done only in very severe cases. ?Follow these instructions at home: ?Eating and drinking ? ?Make diet or lifestyle changes recommended by your health care provider. These may include: ?Drinking fluids throughout the day and not only with meals. ?Cutting down on caffeine or alcohol. ?Eating a healthy and balanced diet to prevent constipation. This may include: ?Choosing foods that are high in fiber, such as beans, whole grains, and fresh fruits and vegetables. ?Limiting foods that  are high in fat and processed sugars, such as fried  and sweet foods. ?Lifestyle ? ?Lose weight if needed. ?Do not use any products that contain nicotine or tobacco. These include cigarettes, chewing tobacco, and vaping devices, such as e-cigarettes. If you need help quitting, ask your health care provider. ?General instructions ?Take over-the-counter and prescription medicines only as told by your health care provider. ?If you were prescribed an antibiotic medicine, take it as told by your health care provider. Do not stop taking the antibiotic even if you start to feel better. ?Use any implants or pessary as told by your health care provider. ?If needed, wear pads to absorb urine leakage. ?Keep a log to track how much and when you drink, and when you need to urinate. This will help your health care provider monitor your condition. ?Keep all follow-up visits. This is important. ?Contact a health care provider if: ?You have a fever or chills. ?Your symptoms do not get better with treatment. ?Your pain and discomfort get worse. ?You have more frequent urges to urinate. ?Get help right away if: ?You are not able to control your bladder. ?Summary ?Overactive bladder refers to a condition in which a person has a sudden and frequent need to urinate. ?Several conditions may lead to an overactive bladder. ?Treatment for overactive bladder depends on the cause and severity of your condition. ?Making lifestyle changes, doing Kegel exercises, keeping a log, and taking medicines can help with this condition. ?This information is not intended to replace advice given to you by your health care provider. Make sure you discuss any questions you have with your health care provider. ?Document Revised: 04/26/2020 Document Reviewed: 04/26/2020 ?Elsevier Patient Education ? [place]. ? ?------------------------------------------- ? ? ?COVID-19 vaccination significantly lowers your risk of severe illness, hospitalization, and death if you get infected. Compared to people who are  up to date with their COVID-19 vaccinations, unvaccinated people aremore likely to get COVID-19, much more likely to be hospitalized with COVID-19, and much more likely to die from COVID-19. ?Like all vaccines, COVID-19 vaccines are not 100% effective at preventing infection. Some people who are up to date with their COVID-19 vaccinations will get COVID-19 breakthrough infection. However, staying up to date with your COVID-19 vaccinations means that you are less likely to have a breakthrough infection and, if you do get sick, you are less likely to get severely ill or die. Staying up to date with COVID-19 vaccination also means you are less likely to spread the disease to others and increases your protection against new variants of SARS-CoV-2, the virus that causes COVID-19. ?

## 2021-12-21 ENCOUNTER — Encounter (INDEPENDENT_AMBULATORY_CARE_PROVIDER_SITE_OTHER): Payer: Self-pay | Admitting: *Deleted

## 2021-12-27 ENCOUNTER — Ambulatory Visit: Payer: Medicaid Other | Admitting: Licensed Clinical Social Worker

## 2021-12-27 DIAGNOSIS — F419 Anxiety disorder, unspecified: Secondary | ICD-10-CM

## 2021-12-27 DIAGNOSIS — F32A Depression, unspecified: Secondary | ICD-10-CM

## 2021-12-27 NOTE — Progress Notes (Signed)
[facility] engaged patient in follow-up session. [facility] provided active listening and validation as patient shared about anxiety and current stressors. [facility] led patient in narrative externalization exercise. [facility] led patient in visualization breathing exercise.  ?

## 2021-12-28 ENCOUNTER — Ambulatory Visit: Payer: Medicaid Other | Admitting: Licensed Clinical Social Worker

## 2021-12-30 ENCOUNTER — Encounter: Payer: Self-pay | Admitting: Physician Assistant

## 2022-01-17 ENCOUNTER — Ambulatory Visit: Payer: Medicaid Other | Admitting: Physician Assistant

## 2022-01-17 ENCOUNTER — Encounter: Payer: Self-pay | Admitting: Physician Assistant

## 2022-01-17 DIAGNOSIS — J069 Acute upper respiratory infection, unspecified: Secondary | ICD-10-CM

## 2022-01-17 DIAGNOSIS — N3281 Overactive bladder: Secondary | ICD-10-CM

## 2022-01-17 MED ORDER — OXYBUTYNIN CHLORIDE ER 10 MG PO TB24: 10.0000 mg | ORAL_TABLET | Freq: Every day | ORAL | 1 refills | Status: DC

## 2022-01-17 MED ORDER — ALBUTEROL SULFATE HFA 108 (90 BASE) MCG/ACT IN AERS: 2.0000 | INHALATION_SPRAY | Freq: Four times a day (QID) | RESPIRATORY_TRACT | 99 refills | Status: DC | PRN

## 2022-01-17 MED ORDER — AZITHROMYCIN 250 MG PO TABS: ORAL_TABLET | ORAL | 0 refills | Status: AC

## 2022-01-17 NOTE — Progress Notes (Signed)
There were no vitals taken for this visit.   Subjective:    Patient ID: [name], female    DOB: 03-May-1976, 46 y.o.   MRN: [id number]  HPI: [name] is a 46 y.o. female presenting on 01/17/2022 for No chief complaint on file.   HPI   This is a telemedicine appointment tgrough [employer].  I connected with  [name] on 01/17/22 by a video enabled telemedicine application and verified that I am speaking with the correct person using two identifiers.   I discussed the limitations of evaluation and management by telemedicine. The patient expressed understanding and agreed to proceed.  Pt is at the hospital (for her mother who was scheduled for surgery).  Provider is at office.   Pt is 46yoF with appointment for routine follow up of OAB.   She is using the oxybutynin XL now and she says it is working well for her.     She says she has been Sick all weekend.  She says she has some sob for which she is Using her albuterol inhaler which is helping.  And feels ear stuffed.  She Feels like it's allergies.   She has not gotten the covid vaccination.  She did take a covid test; she says it was negative.  She felt like it's her sinuses.  She says the she has had Some spinning when she changes positions but only when in bed.  She has sweats all weekend.  Symptoms started about 1 1/2 week ago.     Relevant past medical, surgical, family and social history reviewed and updated as indicated. Interim medical history since our last visit reviewed. Allergies and medications reviewed and updated.   Current Outpatient Medications:    albuterol (VENTOLIN HFA) 108 (90 Base) MCG/ACT inhaler, Inhale into the lungs every 6 (six) hours as needed for wheezing or shortness of breath., Disp: , Rfl:    NAPROXEN DR PO, Take by mouth., Disp: , Rfl:    oxybutynin (DITROPAN XL) 10 MG 24 hr tablet, Take 1 tablet (10 mg total) by mouth at bedtime., Disp: 30 tablet, Rfl: 1   Review of  Systems  Per HPI unless specifically indicated above     Objective:    There were no vitals taken for this visit.  Wt Readings from Last 3 Encounters:  09/22/21 234 lb (106.1 kg)  09/20/21 236 lb (107 kg)  05/24/21 233 lb (105.7 kg)    Physical Exam Constitutional:      General: She is not in acute distress.    Appearance: She is ill-appearing. She is not toxic-appearing.  HENT:     Head: Normocephalic and atraumatic.  Pulmonary:     Effort: Pulmonary effort is normal. No respiratory distress.     Comments: Pt is talking in complete sentences without dyspnea.  She is walking around without dyspnea. Skin:    Comments: Sweat visible on pt's face  Neurological:     Mental Status: She is alert and oriented to person, place, and time.  Psychiatric:        Behavior: Behavior normal.          Assessment & Plan:    Encounter Diagnoses  Name Primary?   OAB (overactive bladder) Yes   Upper respiratory tract infection, unspecified type      -Rx zpack and inhaler -pt to continue ditropan -she can use otc antihistamine and/or sudafed -pt is encouraged to contact office if she fails to improve.  She is encouraged to go to ER if she has trouble with her breathing.  -pt to follow up 3 months.  She is to contact office sooner prn

## 2022-01-25 ENCOUNTER — Encounter: Payer: Self-pay | Admitting: Physician Assistant

## 2022-01-25 ENCOUNTER — Ambulatory Visit: Payer: Medicaid Other | Admitting: Physician Assistant

## 2022-01-25 DIAGNOSIS — J069 Acute upper respiratory infection, unspecified: Secondary | ICD-10-CM

## 2022-01-25 MED ORDER — BENZONATATE 100 MG PO CAPS: ORAL_CAPSULE | ORAL | 1 refills | Status: DC

## 2022-01-25 NOTE — Progress Notes (Signed)
   There were no vitals taken for this visit.   Subjective:    Patient ID: [name], female    DOB: 1975/10/25, 46 y.o.   MRN: [id number]  HPI: [name] is a 46 y.o. female presenting on 01/25/2022 for No chief complaint on file.   HPI  This is a telemedicine appointment through [employer].  I connected with  Danikah Damian Leavell on 01/25/22 by a video enabled telemedicine application and verified that I am speaking with the correct person using two identifiers.   I discussed the limitations of evaluation and management by telemedicine. The patient expressed understanding and agreed to proceed.  Pt is at home.  Provider is at office.   Pt was seen 01/17/22 and was prescribed zpack and MDI.  she  finished the antibiotics Saturday or Sunday  She says she is feeling much improved but she still has  Still bad cough and mucus.  She is still using inhaler.    When she turns to the left she gets swimmy-headed and dizzzy-Feeling.  She is having No fevers.  She does not smoke currently (quit 2016)   She says the Worst thing is the cough.  She isn't using any cough medication because she didn't know what to take.      Relevant past medical, surgical, family and social history reviewed and updated as indicated. Interim medical history since our last visit reviewed. Allergies and medications reviewed and updated.    Current Outpatient Medications:    albuterol (VENTOLIN HFA) 108 (90 Base) MCG/ACT inhaler, Inhale 2 puffs into the lungs every 6 (six) hours as needed for wheezing or shortness of breath., Disp: 1 each, Rfl: PRN   NAPROXEN DR PO, Take by mouth., Disp: , Rfl:    oxybutynin (DITROPAN XL) 10 MG 24 hr tablet, Take 1 tablet (10 mg total) by mouth at bedtime., Disp: 30 tablet, Rfl: 1  Review of Systems  Per HPI unless specifically indicated above     Objective:    There were no vitals taken for this visit.  Wt Readings from Last 3 Encounters:  09/22/21 234 lb (106.1  kg)  09/20/21 236 lb (107 kg)  05/24/21 233 lb (105.7 kg)    Physical Exam Constitutional:      General: She is not in acute distress.    Appearance: She is not ill-appearing.     Comments: Pt looks to be feeling much better than she did 01/17/22  HENT:     Head: Normocephalic and atraumatic.  Pulmonary:     Effort: Pulmonary effort is normal. No respiratory distress.     Comments: Pt is talking in complete sentences without dyspnea Neurological:     Mental Status: She is alert and oriented to person, place, and time.          Assessment & Plan:   Encounter Diagnosis  Name Primary?   Upper respiratory tract infection, unspecified type Yes     -rx Tessalon to [facility] [place].  She is to use some OTC Sudafed and mucinex.  Drink plenty of fluid -she is to contact office Monday if not doing much better.  She understands and agrees

## 2022-01-31 ENCOUNTER — Other Ambulatory Visit: Payer: Self-pay | Admitting: Physician Assistant

## 2022-01-31 ENCOUNTER — Ambulatory Visit: Payer: Medicaid Other | Admitting: Physician Assistant

## 2022-01-31 ENCOUNTER — Ambulatory Visit (HOSPITAL_COMMUNITY)
Admission: RE | Admit: 2022-01-31 | Discharge: 2022-01-31 | Disposition: A | Discharge status: Home / Self Care | Payer: Medicaid Other | Source: Ambulatory Visit | Attending: Physician Assistant | Admitting: Physician Assistant

## 2022-01-31 ENCOUNTER — Encounter: Payer: Self-pay | Admitting: Physician Assistant

## 2022-01-31 ENCOUNTER — Ambulatory Visit: Payer: Medicaid Other | Admitting: Licensed Clinical Social Worker

## 2022-01-31 VITALS — BP 150/90 | HR 74 | Temp 97.5°F | Wt 236.0 lb

## 2022-01-31 DIAGNOSIS — R109 Unspecified abdominal pain: Secondary | ICD-10-CM | POA: Insufficient documentation

## 2022-01-31 LAB — POCT URINALYSIS DIPSTICK
Bilirubin, UA: NEGATIVE
Glucose, UA: NEGATIVE
Ketones, UA: NEGATIVE
Leukocytes, UA: NEGATIVE
Nitrite, UA: NEGATIVE
Protein, UA: NEGATIVE
Spec Grav, UA: >=1.03 — AB
Urobilinogen, UA: 0.2 U/dL
pH, UA: 5.5

## 2022-01-31 MED ORDER — IOHEXOL 300 MG/ML  SOLN
100.0000 mL | Freq: Once | INTRAMUSCULAR | Status: AC | PRN
  Administered 2022-01-31: 100 mL via INTRAVENOUS

## 2022-01-31 MED ORDER — CYCLOBENZAPRINE HCL 10 MG PO TABS: 10.0000 mg | ORAL_TABLET | Freq: Three times a day (TID) | ORAL | 0 refills | Status: DC | PRN

## 2022-01-31 NOTE — Progress Notes (Signed)
BP (!) 150/90   Pulse 74   Temp (!) 97.5 F (36.4 C)   Wt 236 lb (107 kg)   SpO2 96%   BMI 43.52 kg/m    Subjective:    Patient ID: [name], female    DOB: 06/13/1976, 46 y.o.   MRN: [id number]  HPI: [name] is a 46 y.o. female presenting on 01/31/2022 for No chief complaint on file.   HPI   Pt is 77yoF who was Seen 5/30- had been sick for several day- with sob and congestion.  Treated with zpack and inhaler. Seen 6/7-  uri was improved but still with cough and mucus.  Rx tessalon and to use her inhaler  She feels numb and inside her body it hurts "bad" on the right abd.  It is worse with sitting.  She is crying due to pain.  Pain is a 7 or 8.   It is constant.  It started hurrint like this on Thursday as dull.   It has progressed.   She is eating normal.  She is moving BMs normal.  Some nausea she thinks due to the pain.  No emesis.  No fevers.  It is not worsened by coughing.    Laying down is only way to get some relief.  Sitting is the worst.    She is s/p cholecystectomy.  She had hysterectormy.  She still has her ovaries.    It hurts all the way to the back.      Relevant past medical, surgical, family and social history reviewed and updated as indicated. Interim medical history since our last visit reviewed. Allergies and medications reviewed and updated.   Current Outpatient Medications:    albuterol (VENTOLIN HFA) 108 (90 Base) MCG/ACT inhaler, Inhale 2 puffs into the lungs every 6 (six) hours as needed for wheezing or shortness of breath., Disp: 1 each, Rfl: PRN   benzonatate (TESSALON PERLES) 100 MG capsule, 1-2 po q 8 hour prn cough, Disp: 20 capsule, Rfl: 1   NAPROXEN DR PO, Take by mouth., Disp: , Rfl:    oxybutynin (DITROPAN XL) 10 MG 24 hr tablet, Take 1 tablet (10 mg total) by mouth at bedtime., Disp: 30 tablet, Rfl: 1   Review of Systems  Per HPI unless specifically indicated above     Objective:    BP (!) 150/90   Pulse 74    Temp (!) 97.5 F (36.4 C)   Wt 236 lb (107 kg)   SpO2 96%   BMI 43.52 kg/m   Wt Readings from Last 3 Encounters:  01/31/22 236 lb (107 kg)  09/22/21 234 lb (106.1 kg)  09/20/21 236 lb (107 kg)    Physical Exam Vitals reviewed.  Constitutional:      Appearance: She is well-developed. She is not toxic-appearing.     Comments: Pt is fidgety, sits and stands and paces around room trying to get more comfortable.   HENT:     Head: Normocephalic and atraumatic.     Left Ear: Tympanic membrane and ear canal normal.  Cardiovascular:     Rate and Rhythm: Normal rate and regular rhythm.  Pulmonary:     Effort: Pulmonary effort is normal.     Breath sounds: Normal breath sounds.  Abdominal:     General: Bowel sounds are normal.     Palpations: Abdomen is soft. There is no fluid wave, hepatomegaly, splenomegaly, mass or pulsatile mass.     Tenderness: There is abdominal  tenderness in the right upper quadrant and right lower quadrant. There is right CVA tenderness and guarding. There is no left CVA tenderness or rebound.  Musculoskeletal:     Cervical back: Neck supple.       Back:     Right lower leg: No edema.     Left lower leg: No edema.     Comments: Very Tender to light touch and deeper palpation   Lymphadenopathy:     Cervical: No cervical adenopathy.  Skin:    General: Skin is warm and dry.     Comments: Pt is clammy, sweaty  Neurological:     Mental Status: She is alert and oriented to person, place, and time.  Psychiatric:        Behavior: Behavior normal.        UA trace blood otherwise unremarkable     Assessment & Plan:   Encounter Diagnoses  Name Primary?   Left flank pain Yes   Abdominal pain, unspecified abdominal location      -Pt sent for CT abdomen and pelvis -She has already submitted application for [employer] financial assistance

## 2022-02-06 NOTE — Telephone Encounter (Signed)
Assisted pt with completing [employer] financial [employer] application for submission

## 2022-02-08 ENCOUNTER — Ambulatory Visit: Payer: Medicaid Other | Admitting: Licensed Clinical Social Worker

## 2022-02-08 DIAGNOSIS — F419 Anxiety disorder, unspecified: Secondary | ICD-10-CM

## 2022-02-08 DIAGNOSIS — F32A Depression, unspecified: Secondary | ICD-10-CM

## 2022-02-08 NOTE — Progress Notes (Signed)
[facility] engaged patient in termination session. [facility] assisted patient in processing thoughts and emotions related to termination and recent stressors. [facility] reviewed patient's progress and worked with patient on potential opportunities for boundary setting in her interpersonal life. Patient interested in continued [facility] support.

## 2022-02-16 ENCOUNTER — Encounter: Payer: Self-pay | Admitting: Physician Assistant

## 2022-02-16 ENCOUNTER — Ambulatory Visit: Payer: Medicaid Other | Admitting: Physician Assistant

## 2022-02-16 VITALS — BP 130/86 | HR 94 | Temp 96.4°F

## 2022-02-16 DIAGNOSIS — N3281 Overactive bladder: Secondary | ICD-10-CM

## 2022-02-16 DIAGNOSIS — M25522 Pain in left elbow: Secondary | ICD-10-CM

## 2022-02-16 DIAGNOSIS — N6002 Solitary cyst of left breast: Secondary | ICD-10-CM

## 2022-02-16 DIAGNOSIS — E669 Obesity, unspecified: Secondary | ICD-10-CM

## 2022-02-16 NOTE — Progress Notes (Signed)
BP 130/86   Pulse 94   Temp (!) 96.4 F (35.8 C)   SpO2 95%    Subjective:    Patient ID: [name], female    DOB: 03-06-1976, 46 y.o.   MRN: [id number]  HPI: Ezzie Cloie Wooden is a 46 y.o. female presenting on 02/16/2022 for No chief complaint on file.   HPI  Pt returns to office to evaluate elbow and requesting referral for it now that she has [employer] financial assistance.  She has Left elbow pain.  Makes it all go to stleep.  No strentgh left elbow and hand.  "It kills me" it hurts so bad.  Sometimes 8/10, today neglibile.  She says being active makes it worse.  She wears an elbow strap sometimes but she worries about over-tightening it.  She was Last seen in February for this.   She has cafa now.  Pt had similar issue with her right elbow and had surgery to fix it; she describes some nerve entanglement condition.    She says RUQ pain is like this too- feels asleep.  She had RUQ pain at her last OV and underwent CT scan.     She doesn't drink alcohol and has never been a heavy drinker.  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  Pt says she also is again having problems with the bump on her breast.   She has been having problems with this since early February.  She has been on antibiotics and she got diagnostic mammogram and [place].   Imaging shows sebaceous cyst.  Pt says the cyst has improved and gotten worse again in a cycle ever since.  She is no longer mashing on the cyst.  It is bothering her a lot.     Relevant past medical, surgical, family and social history reviewed and updated as indicated. Interim medical history since our last visit reviewed. Allergies and medications reviewed and updated.   Current Outpatient Medications:    albuterol (VENTOLIN HFA) 108 (90 Base) MCG/ACT inhaler, Inhale 2 puffs into the lungs every 6 (six) hours as needed for wheezing or shortness of breath., Disp: 1 each, Rfl: PRN   benzonatate (TESSALON PERLES) 100 MG capsule, 1-2 po q  8 hour prn cough, Disp: 20 capsule, Rfl: 1   cyclobenzaprine (FLEXERIL) 10 MG tablet, Take 1 tablet (10 mg total) by mouth 3 (three) times daily as needed for muscle spasms., Disp: 30 tablet, Rfl: 0   oxybutynin (DITROPAN XL) 10 MG 24 hr tablet, Take 1 tablet (10 mg total) by mouth at bedtime., Disp: 30 tablet, Rfl: 1   NAPROXEN DR PO, Take by mouth. (Patient not taking: Reported on 02/16/2022), Disp: , Rfl:    Review of Systems  Per HPI unless specifically indicated above     Objective:    BP 130/86   Pulse 94   Temp (!) 96.4 F (35.8 C)   SpO2 95%   Wt Readings from Last 3 Encounters:  01/31/22 236 lb (107 kg)  09/22/21 234 lb (106.1 kg)  09/20/21 236 lb (107 kg)    Physical Exam Vitals reviewed.  Constitutional:      General: She is not in acute distress.    Appearance: She is well-developed. She is obese. She is not ill-appearing.  HENT:     Head: Normocephalic and atraumatic.  Cardiovascular:     Rate and Rhythm: Normal rate and regular rhythm.  Pulmonary:     Effort: Pulmonary effort is  normal.     Breath sounds: Normal breath sounds.  Chest:     Comments: Reddish skin with nodule 9 o-clock position left breast.   Abdominal:     General: Bowel sounds are normal.     Palpations: Abdomen is soft. There is no hepatomegaly or mass.     Tenderness: There is no abdominal tenderness. There is no guarding or rebound.  Musculoskeletal:     Left elbow: No swelling. Normal range of motion. No tenderness.     Left forearm: No swelling or tenderness.     Left wrist: No swelling. Normal range of motion. Normal pulse.     Left hand: No swelling or tenderness. Normal range of motion.     Cervical back: Neck supple.     Right lower leg: No edema.     Left lower leg: No edema.  Lymphadenopathy:     Cervical: No cervical adenopathy.  Skin:    General: Skin is warm and dry.  Neurological:     Mental Status: She is alert and oriented to person, place, and time.  Psychiatric:         Behavior: Behavior normal.           Assessment & Plan:    Encounter Diagnoses  Name Primary?   Left elbow pain Yes   Cyst of left breast    Obesity, unspecified classification, unspecified obesity type, unspecified whether serious comorbidity present    OAB (overactive bladder)      -refer To surgery for left breast cyst as it is continuing to flare up and interfere with life -refer To orthopedist for left elbow  -pt is counseled on Weight management and safe exercise -discussed Oxybutynin and cognitive impairment.  Pt is given handout on oxybutynin  -pt to follow up September as scheduled

## 2022-02-16 NOTE — Patient Instructions (Signed)
Oxybutynin Tablets What is this medication? OXYBUTYNIN (ox i BYOO ti nin) treats symptoms of an overactive bladder, such as loss of bladder control or frequent need to urinate. It works by relaxing muscles in the bladder. It belongs to a group of medications called antispasmodics. This medicine may be used for other purposes; ask your health care provider or pharmacist if you have questions. COMMON BRAND NAME(S): Ditropan What should I tell my care team before I take this medication? They need to know if you have any of these conditions: Autonomic neuropathy Dementia Difficulty passing urine Glaucoma Intestinal obstruction Kidney disease Liver disease Myasthenia gravis Parkinson's disease An unusual or allergic reaction to oxybutynin, other medications, foods, dyes, or preservatives Pregnant or trying to get pregnant Breast-feeding How should I use this medication? Take this medication by mouth with a glass of water. Follow the directions on the prescription label. You can take this medication with or without food. Take your medication at regular intervals. Do not take your medication more often than directed. Talk to your care team about the use of this medication in children. Special care may be needed. While this medication may be prescribed for children as young as 5 years for selected conditions, precautions do apply. Overdosage: If you think you have taken too much of this medicine contact a poison control center or emergency room at once. NOTE: This medicine is only for you. Do not share this medicine with others. What if I miss a dose? If you miss a dose, take it as soon as you can. If it is almost time for your next dose, take only that dose. Do not take double or extra doses. What may interact with this medication? Antihistamines for allergy, cough and cold Atropine Certain medications for bladder problems like oxybutynin, tolterodine Certain medications for Parkinson's  disease like benztropine, trihexyphenidyl Certain medications for stomach problems like dicyclomine, hyoscyamine Certain medications for travel sickness like scopolamine Clarithromycin Erythromycin Ipratropium Medications for fungal infections, like fluconazole, itraconazole, ketoconazole or voriconazole This list may not describe all possible interactions. Give your health care provider a list of all the medicines, herbs, non-prescription drugs, or dietary supplements you use. Also tell them if you smoke, drink alcohol, or use illegal drugs. Some items may interact with your medicine. What should I watch for while using this medication? It may take a few weeks to notice the full benefit from this medication. You may need to limit your intake tea, coffee, caffeinated sodas, and alcohol. These drinks may make your symptoms worse. You may get drowsy or dizzy. Do not drive, use machinery, or do anything that needs mental alertness until you know how this medication affects you. Do not stand or sit up quickly, especially if you are an older patient. This reduces the risk of dizzy or fainting spells. Alcohol may interfere with the effect of this medication. Avoid alcoholic drinks. Your mouth may get dry. Chewing sugarless gum or sucking hard candy, and drinking plenty of water may help. Contact your care team if the problem does not go away or is severe. This medication may cause dry eyes and blurred vision. If you wear contact lenses, you may feel some discomfort. Lubricating drops may help. See your eyecare professional if the problem does not go away or is severe. Avoid extreme heat. This medication can cause you to sweat less than normal. Your body temperature could increase to dangerous levels, which may lead to heat stroke. What side effects may I notice from receiving  this medication? Side effects that you should report to your care team as soon as possible: Allergic reactions or angioedema--skin  rash, itching, hives, swelling of the face, eyes, lips, tongue, arms, or legs, trouble swallowing or breathing Sudden eye pain or change in vision such as blurry vision, seeing halos around lights, vision loss Trouble passing urine Side effects that usually do not require medical attention (report to your care team if they continue or are bothersome): Confusion Constipation Dizziness Drowsiness Dry mouth Headache This list may not describe all possible side effects. Call your doctor for medical advice about side effects. You may report side effects to FDA at 1-800-FDA-1088. Where should I keep my medication? Keep out of the reach of children. Store at room temperature between 15 and 30 degrees C (59 and 86 degrees F). Protect from moisture and humidity. Throw away any unused medication after the expiration date. NOTE: This sheet is a summary. It may not cover all possible information. If you have questions about this medicine, talk to your doctor, pharmacist, or health care provider.  2023 Elsevier/Gold Standard (2020-12-22 00:00:00)

## 2022-02-20 ENCOUNTER — Encounter (INDEPENDENT_AMBULATORY_CARE_PROVIDER_SITE_OTHER): Payer: Self-pay

## 2022-02-20 ENCOUNTER — Telehealth (INDEPENDENT_AMBULATORY_CARE_PROVIDER_SITE_OTHER): Payer: Self-pay

## 2022-02-20 ENCOUNTER — Other Ambulatory Visit (INDEPENDENT_AMBULATORY_CARE_PROVIDER_SITE_OTHER): Payer: Self-pay

## 2022-02-20 ENCOUNTER — Encounter (INDEPENDENT_AMBULATORY_CARE_PROVIDER_SITE_OTHER): Payer: Self-pay | Admitting: Gastroenterology

## 2022-02-20 ENCOUNTER — Ambulatory Visit (INDEPENDENT_AMBULATORY_CARE_PROVIDER_SITE_OTHER): Payer: Self-pay | Admitting: Gastroenterology

## 2022-02-20 VITALS — BP 138/89 | HR 84 | Temp 98.0°F | Ht 61.0 in | Wt 238.0 lb

## 2022-02-20 DIAGNOSIS — K625 Hemorrhage of anus and rectum: Secondary | ICD-10-CM

## 2022-02-20 DIAGNOSIS — K5900 Constipation, unspecified: Secondary | ICD-10-CM

## 2022-02-20 MED ORDER — PEG 3350-KCL-NA BICARB-NACL 420 G PO SOLR: 4000.0000 mL | ORAL | 0 refills | Status: DC

## 2022-02-20 NOTE — Progress Notes (Signed)
Referring Provider: Soyla Dryer, PA-C Primary Care Physician:  Soyla Dryer, PA-C Primary GI Physician: new  Chief Complaint  Patient presents with   Blood In Stools    Blood in stools for years. Does have hemorrhoids. Constipation for about 10 years. Tried stool softners and linzess with no relief.    HPI:   [name] is a 46 y.o. female with past medical history of bipolar 1 disorder, depression.   Patient presenting today as a new patient for blood in stools and constipation.   Recent CT A/P w contrast 01/31/22: No acute abnormality. No evidence of bowel obstruction or acute bowel inflammation. Normal appendix.  FOBT negative 10/04/21  TSH 1.923 05/31/21  Today, patient reports that she had a first cousin recently diagnosed with colon cancer. She denies any other family hx of CRC, though there is significant hx of cancer in general in her family. She has never had a colonoscopy. She reports that for the past 10 years she has had rectal bleeding intermittently, she reports that she would see it mixed in with the stools and in the toilet with the water appearing blood. She has not had rectal bleeding for the past week or so, though usually this occurs maybe twice per week. She does endorse constipation, she tries to go everyday, if she cant she states she will make herself go by straining. She has tried stool softeners and linzess in the past but thinks it was a lower dose, states these did not help her constipation. Is trying to improve her diet and adding more fruit. She does drink a decent amount of water, though wonders If she is drinking enough. She states that she usually has a BM in the morning, if she does not go in the morning she feels bad and sluggish. She denies black stools or abdominal pain. She denies rectal bleeding, sometimes has some itching at times. She has had 4 kids previously and suspects  that bleeding could be related to hemorrhoids as she has had  issues with these since. Denies nausea, vomiting, changes in appetite or weight loss.   She has issues with heartburn on occasion, she sometimes drinks milk or will take pepto bismol tablets, sometimes will make herself vomit which helps. Previously on protonix or prevacid. She tries to avoid eating late and watching what she eats. Feels GERD is well managed currently, does not wish to take any Rx for this.  NSAID use: was using naproxen daily for a few months for back pain.  Social hx: no etoh or tobacco  Fam hx: no CRC or liver disease   Last Colonoscopy:never Last Endoscopy:never   Recommendations:   Past Medical History:  Diagnosis Date   Bipolar 1 disorder ([place])    Depression     Past Surgical History:  Procedure Laterality Date   ABDOMINAL HYSTERECTOMY     CARPAL TUNNEL RELEASE Right 2007   CHOLECYSTECTOMY     GALLBLADDER SURGERY     PARTIAL HYSTERECTOMY      Current Outpatient Medications  Medication Sig Dispense Refill   albuterol (VENTOLIN HFA) 108 (90 Base) MCG/ACT inhaler Inhale 2 puffs into the lungs every 6 (six) hours as needed for wheezing or shortness of breath. 1 each PRN   benzonatate (TESSALON PERLES) 100 MG capsule 1-2 po q 8 hour prn cough 20 capsule 1   cyclobenzaprine (FLEXERIL) 10 MG tablet Take 1 tablet (10 mg total) by mouth 3 (three) times daily as needed for muscle spasms.  30 tablet 0   NAPROXEN DR PO Take by mouth.     No current facility-administered medications for this visit.    Allergies as of 02/20/2022 - Review Complete 02/20/2022  Allergen Reaction Noted   Cortisone Rash 05/24/2021    Family History  Problem Relation Age of Onset   Cancer Mother    Kidney cancer Mother    Hypertension Mother    Diabetes Mother    Heart disease Father    Hypertension Father    Prostate cancer Father     Social History   Socioeconomic History   Marital status: Married    Spouse name: Not on file   Number of children: Not on file   Years of  education: Not on file   Highest education level: Not on file  Occupational History   Not on file  Tobacco Use   Smoking status: Former    Types: Cigarettes    Quit date: 05/22/2015    Years since quitting: 6.7    Passive exposure: Past   Smokeless tobacco: Never  Vaping Use   Vaping Use: Former  Substance and Sexual Activity   Alcohol use: Not Currently    Comment: history social etoh   Drug use: Yes    Types: Marijuana, "Crack" cocaine    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   Sexual activity: Not on file  Other Topics Concern   Not on file  Social History Narrative   Not on file   Social Determinants of Health   Financial Resource Strain: Not on file  Food Insecurity: Not on file  Transportation Needs: Not on file  Physical Activity: Not on file  Stress: Not on file  Social Connections: Not on file   Review of systems General: negative for malaise, night sweats, fever, chills, weight loss Neck: Negative for lumps, goiter, pain and significant neck swelling Resp: Negative for cough, wheezing, dyspnea at rest CV: Negative for chest pain, leg swelling, palpitations, orthopnea GI: denies melena,  nausea, vomiting, diarrhea, dysphagia, odyonophagia, early satiety or unintentional weight loss. +constipation +rectal bleeding +rectal itching MSK: Negative for joint pain or swelling, back pain, and muscle pain. Derm: Negative for itching or rash Psych: Denies depression, anxiety, memory loss, confusion. No homicidal or suicidal ideation.  Heme: Negative for prolonged bleeding, bruising easily, and swollen nodes. Endocrine: Negative for cold or heat intolerance, polyuria, polydipsia and goiter. Neuro: negative for tremor, gait imbalance, syncope and seizures. The remainder of the review of systems is noncontributory.  Physical Exam: BP 138/89 (BP Location: Left Arm, Patient Position: Sitting, Cuff Size: Large)   Pulse 84   Temp 98 F (36.7 C) (Oral)   Ht '5\' 1"'$  (1.549  m)   Wt 238 lb (108 kg)   BMI 44.97 kg/m  General:   Alert and oriented. No distress noted. Pleasant and cooperative.  Head:  Normocephalic and atraumatic. Eyes:  Conjuctiva clear without scleral icterus. Mouth:  Oral mucosa pink and moist. Good dentition. No lesions. Heart: Normal rate and rhythm, s1 and s2 heart sounds present.  Lungs: Clear lung sounds in all lobes. Respirations equal and unlabored. Abdomen:  +BS, soft, non-tender and non-distended. No rebound or guarding. No HSM or masses noted. Derm: No palmar erythema or jaundice Msk:  Symmetrical without gross deformities. Normal posture. Extremities:  Without edema. Neurologic:  Alert and  oriented x4 Psych:  Alert and cooperative. Normal mood and affect.  Invalid input(s): "6 MONTHS"   ASSESSMENT: [name]  is a 46 y.o. female presenting today as a new patient for rectal bleeding and constipation.  Rectal bleeding and constipation for maybe 10 years. Rectal bleeding occurs usually twice per week, especially during times of more straining. Has tried stool softeners and low dose Linzess without good results. Is increasing fruit in her diet and drinks a lot of water but still has to strain quite a bit to defecate. Recent TSH in Oct 2022 was WNL. Has some rectal itching at times but denies pain, does have hx of hemorrhoids after having children. Given chronicity of rectal bleeding, in presence of constipation and lack of other red flag symptoms, suspect bleeding is likely secondary to hemorrhoids and straining, however, she has never had a colonoscopy. We will get her scheduled for colonoscopy for further evaluation. Indications, risks and benefits of procedure discussed in detail with patient. Patient verbalized understanding and is in agreement to proceed with Colonoscopy at this time.   Will try linzess 141mg samples, she should increase water, fruits, veggies and whole grains, she will make me aware how linzess works for  her, can go up to 2948m if doesn't have good results with 14526m Will need patient assistance through [facility] she likes this as she does not have insurance. Encouraged her to limit toilet time and avoid straining to prevent worsening of hemorrhoids.   Patient also counseled on frequent NSAID use as she was previously using naproxen regularly. Discussed that these medications can be very detrimental to GI tract, causing inflammation, ulcers and damage to the lining.    PLAN:  Schedule colonoscopy-endo 3, 2 day prep 2. Samples of linzess 145m41maily, consider 290 if 145 does not provide results (will need pt assistance for Rx) 3. Increase water intake, fruits, veggies, whole grains 4. Limit toilet time, avoid straining   All questions were answered, patient verbalized understanding and is in agreement with plan as outlined above.    Follow Up: 3 months   Gloria Cregan L. CarlAlver SorrowN, APRN, AGNP-C Adult-Gerontology Nurse Practitioner [facility]

## 2022-02-20 NOTE — H&P (View-Only) (Signed)
Referring Provider: Soyla Dryer, PA-C Primary Care Physician:  Soyla Dryer, PA-C Primary GI Physician: new  Chief Complaint  Patient presents with   Blood In Stools    Blood in stools for years. Does have hemorrhoids. Constipation for about 10 years. Tried stool softners and linzess with no relief.    HPI:   [name] is a 46 y.o. female with past medical history of bipolar 1 disorder, depression.   Patient presenting today as a new patient for blood in stools and constipation.   Recent CT A/P w contrast 01/31/22: No acute abnormality. No evidence of bowel obstruction or acute bowel inflammation. Normal appendix.  FOBT negative 10/04/21  TSH 1.923 05/31/21  Today, patient reports that she had a first cousin recently diagnosed with colon cancer. She denies any other family hx of CRC, though there is significant hx of cancer in general in her family. She has never had a colonoscopy. She reports that for the past 10 years she has had rectal bleeding intermittently, she reports that she would see it mixed in with the stools and in the toilet with the water appearing blood. She has not had rectal bleeding for the past week or so, though usually this occurs maybe twice per week. She does endorse constipation, she tries to go everyday, if she cant she states she will make herself go by straining. She has tried stool softeners and linzess in the past but thinks it was a lower dose, states these did not help her constipation. Is trying to improve her diet and adding more fruit. She does drink a decent amount of water, though wonders If she is drinking enough. She states that she usually has a BM in the morning, if she does not go in the morning she feels bad and sluggish. She denies black stools or abdominal pain. She denies rectal bleeding, sometimes has some itching at times. She has had 4 kids previously and suspects  that bleeding could be related to hemorrhoids as she has had  issues with these since. Denies nausea, vomiting, changes in appetite or weight loss.   She has issues with heartburn on occasion, she sometimes drinks milk or will take pepto bismol tablets, sometimes will make herself vomit which helps. Previously on protonix or prevacid. She tries to avoid eating late and watching what she eats. Feels GERD is well managed currently, does not wish to take any Rx for this.  NSAID use: was using naproxen daily for a few months for back pain.  Social hx: no etoh or tobacco  Fam hx: no CRC or liver disease   Last Colonoscopy:never Last Endoscopy:never   Recommendations:   Past Medical History:  Diagnosis Date   Bipolar 1 disorder ([place])    Depression     Past Surgical History:  Procedure Laterality Date   ABDOMINAL HYSTERECTOMY     CARPAL TUNNEL RELEASE Right 2007   CHOLECYSTECTOMY     GALLBLADDER SURGERY     PARTIAL HYSTERECTOMY      Current Outpatient Medications  Medication Sig Dispense Refill   albuterol (VENTOLIN HFA) 108 (90 Base) MCG/ACT inhaler Inhale 2 puffs into the lungs every 6 (six) hours as needed for wheezing or shortness of breath. 1 each PRN   benzonatate (TESSALON PERLES) 100 MG capsule 1-2 po q 8 hour prn cough 20 capsule 1   cyclobenzaprine (FLEXERIL) 10 MG tablet Take 1 tablet (10 mg total) by mouth 3 (three) times daily as needed for muscle spasms.  30 tablet 0   NAPROXEN DR PO Take by mouth.     No current facility-administered medications for this visit.    Allergies as of 02/20/2022 - Review Complete 02/20/2022  Allergen Reaction Noted   Cortisone Rash 05/24/2021    Family History  Problem Relation Age of Onset   Cancer Mother    Kidney cancer Mother    Hypertension Mother    Diabetes Mother    Heart disease Father    Hypertension Father    Prostate cancer Father     Social History   Socioeconomic History   Marital status: Married    Spouse name: Not on file   Number of children: Not on file   Years of  education: Not on file   Highest education level: Not on file  Occupational History   Not on file  Tobacco Use   Smoking status: Former    Types: Cigarettes    Quit date: 05/22/2015    Years since quitting: 6.7    Passive exposure: Past   Smokeless tobacco: Never  Vaping Use   Vaping Use: Former  Substance and Sexual Activity   Alcohol use: Not Currently    Comment: history social etoh   Drug use: Yes    Types: Marijuana, "Crack" cocaine    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   Sexual activity: Not on file  Other Topics Concern   Not on file  Social History Narrative   Not on file   Social Determinants of Health   Financial Resource Strain: Not on file  Food Insecurity: Not on file  Transportation Needs: Not on file  Physical Activity: Not on file  Stress: Not on file  Social Connections: Not on file   Review of systems General: negative for malaise, night sweats, fever, chills, weight loss Neck: Negative for lumps, goiter, pain and significant neck swelling Resp: Negative for cough, wheezing, dyspnea at rest CV: Negative for chest pain, leg swelling, palpitations, orthopnea GI: denies melena,  nausea, vomiting, diarrhea, dysphagia, odyonophagia, early satiety or unintentional weight loss. +constipation +rectal bleeding +rectal itching MSK: Negative for joint pain or swelling, back pain, and muscle pain. Derm: Negative for itching or rash Psych: Denies depression, anxiety, memory loss, confusion. No homicidal or suicidal ideation.  Heme: Negative for prolonged bleeding, bruising easily, and swollen nodes. Endocrine: Negative for cold or heat intolerance, polyuria, polydipsia and goiter. Neuro: negative for tremor, gait imbalance, syncope and seizures. The remainder of the review of systems is noncontributory.  Physical Exam: BP 138/89 (BP Location: Left Arm, Patient Position: Sitting, Cuff Size: Large)   Pulse 84   Temp 98 F (36.7 C) (Oral)   Ht '5\' 1"'$  (1.549  m)   Wt 238 lb (108 kg)   BMI 44.97 kg/m  General:   Alert and oriented. No distress noted. Pleasant and cooperative.  Head:  Normocephalic and atraumatic. Eyes:  Conjuctiva clear without scleral icterus. Mouth:  Oral mucosa pink and moist. Good dentition. No lesions. Heart: Normal rate and rhythm, s1 and s2 heart sounds present.  Lungs: Clear lung sounds in all lobes. Respirations equal and unlabored. Abdomen:  +BS, soft, non-tender and non-distended. No rebound or guarding. No HSM or masses noted. Derm: No palmar erythema or jaundice Msk:  Symmetrical without gross deformities. Normal posture. Extremities:  Without edema. Neurologic:  Alert and  oriented x4 Psych:  Alert and cooperative. Normal mood and affect.  Invalid input(s): "6 MONTHS"   ASSESSMENT: Kaylea Damian Leavell  is a 46 y.o. female presenting today as a new patient for rectal bleeding and constipation.  Rectal bleeding and constipation for maybe 10 years. Rectal bleeding occurs usually twice per week, especially during times of more straining. Has tried stool softeners and low dose Linzess without good results. Is increasing fruit in her diet and drinks a lot of water but still has to strain quite a bit to defecate. Recent TSH in Oct 2022 was WNL. Has some rectal itching at times but denies pain, does have hx of hemorrhoids after having children. Given chronicity of rectal bleeding, in presence of constipation and lack of other red flag symptoms, suspect bleeding is likely secondary to hemorrhoids and straining, however, she has never had a colonoscopy. We will get her scheduled for colonoscopy for further evaluation. Indications, risks and benefits of procedure discussed in detail with patient. Patient verbalized understanding and is in agreement to proceed with Colonoscopy at this time.   Will try linzess 163mg samples, she should increase water, fruits, veggies and whole grains, she will make me aware how linzess works for  her, can go up to 2971m if doesn't have good results with 14563m Will need patient assistance through [employer] she likes this as she does not have insurance. Encouraged her to limit toilet time and avoid straining to prevent worsening of hemorrhoids.   Patient also counseled on frequent NSAID use as she was previously using naproxen regularly. Discussed that these medications can be very detrimental to GI tract, causing inflammation, ulcers and damage to the lining.    PLAN:  Schedule colonoscopy-endo 3, 2 day prep 2. Samples of linzess 145m31maily, consider 290 if 145 does not provide results (will need pt assistance for Rx) 3. Increase water intake, fruits, veggies, whole grains 4. Limit toilet time, avoid straining   All questions were answered, patient verbalized understanding and is in agreement with plan as outlined above.    Follow Up: 3 months   Addalynn Kumari L. CarlAlver SorrowN, APRN, AGNP-C Adult-Gerontology Nurse Practitioner [facility]

## 2022-02-20 NOTE — Patient Instructions (Addendum)
We will try samples of linzess 173mg, let me know how these work for you, we can try doing patient assistance through the manufacturer if these provide good results as you do not have insurance, we can go up to 2913m if you are still having difficulty with needing to strain Try to increase water, fruits, veggies and whole grain intake to help as well We will get you scheduled for colonoscopy for further evaluation.  Follow up 3 months

## 2022-02-20 NOTE — Telephone Encounter (Signed)
[name] Ann Wallis Spizzirri, CMA  ?

## 2022-02-22 ENCOUNTER — Encounter (INDEPENDENT_AMBULATORY_CARE_PROVIDER_SITE_OTHER): Payer: Self-pay

## 2022-03-09 ENCOUNTER — Encounter: Payer: Self-pay | Admitting: Orthopedic Surgery

## 2022-03-09 ENCOUNTER — Ambulatory Visit (INDEPENDENT_AMBULATORY_CARE_PROVIDER_SITE_OTHER): Payer: Self-pay | Admitting: Orthopedic Surgery

## 2022-03-09 DIAGNOSIS — M25529 Pain in unspecified elbow: Secondary | ICD-10-CM | POA: Insufficient documentation

## 2022-03-09 DIAGNOSIS — R202 Paresthesia of skin: Secondary | ICD-10-CM

## 2022-03-09 DIAGNOSIS — R32 Unspecified urinary incontinence: Secondary | ICD-10-CM | POA: Insufficient documentation

## 2022-03-09 DIAGNOSIS — R200 Anesthesia of skin: Secondary | ICD-10-CM | POA: Insufficient documentation

## 2022-03-09 NOTE — Patient Instructions (Signed)
[name] from [facility] address is [address] The phone number is [phone]  Dr Laurence Spates

## 2022-03-09 NOTE — Progress Notes (Signed)
Chief Complaint  Patient presents with   Elbow Pain    Left / states had right ulnar nerve release, and now needs left symptoms similar to what the right was, tingling painful goes into arm/ elbow shoulder     HPI: 46 year old female history of ulnar nerve release and carpal tunnel release right upper extremity about 15 years ago in [place] presents with what she says are similar symptoms in the left upper extremity which she describes as follows.  She has trouble holding onto the steering wheel, she is lost grip in her left hand, she has numbness and tingling in the small ring and long finger.  She has radiating pain from the elbow down to the hand.    Past Medical History:  Diagnosis Date   Bipolar 1 disorder ([place])    Depression     General appearance: Well-developed well-nourished no gross deformities  Cardiovascular normal pulse and perfusion normal color without edema  Neurologically no sensation loss or deficits or pathologic reflexes  Psychological: Awake alert and oriented x3 mood and affect normal  Skin no lacerations or ulcerations no nodularity no palpable masses, no erythema or nodularity  Musculoskeletal:  She is ambulatory without assistive device  The right arm has an incision medially from her previous ulnar nerve release presumably  On the left side we find normal range of motion of the elbow wrist and hand tenderness over the carpal tunnel and medial epicondyles.  Tinel's is equivocal  Imaging no new imaging  A/P  Unfortunately I do not do the cubital tunnel releases  She will need a nerve conduction study and then referral to a hand specialist for definitive care

## 2022-03-13 ENCOUNTER — Encounter (HOSPITAL_COMMUNITY): Payer: Self-pay

## 2022-03-13 ENCOUNTER — Encounter (HOSPITAL_COMMUNITY)
Admission: RE | Admit: 2022-03-13 | Discharge: 2022-03-13 | Disposition: A | Discharge status: Home / Self Care | Payer: Medicaid Other | Source: Ambulatory Visit | Attending: Gastroenterology | Admitting: Gastroenterology

## 2022-03-13 HISTORY — DX: Other symptoms and signs involving the musculoskeletal system: R29.898

## 2022-03-16 ENCOUNTER — Encounter (HOSPITAL_COMMUNITY): Payer: Self-pay | Admitting: Gastroenterology

## 2022-03-16 ENCOUNTER — Ambulatory Visit (HOSPITAL_COMMUNITY)
Admission: RE | Admit: 2022-03-16 | Discharge: 2022-03-16 | Disposition: A | Discharge status: Home / Self Care | Payer: Self-pay | Attending: Gastroenterology | Admitting: Gastroenterology

## 2022-03-16 ENCOUNTER — Ambulatory Visit (HOSPITAL_COMMUNITY): Payer: Self-pay | Admitting: Certified Registered"

## 2022-03-16 ENCOUNTER — Ambulatory Visit (HOSPITAL_BASED_OUTPATIENT_CLINIC_OR_DEPARTMENT_OTHER): Payer: Self-pay | Admitting: Certified Registered"

## 2022-03-16 ENCOUNTER — Encounter (HOSPITAL_COMMUNITY)
Admission: RE | Disposition: A | Discharge status: Home / Self Care | Payer: Self-pay | Source: Home / Self Care | Attending: Gastroenterology

## 2022-03-16 DIAGNOSIS — K5730 Diverticulosis of large intestine without perforation or abscess without bleeding: Secondary | ICD-10-CM | POA: Insufficient documentation

## 2022-03-16 DIAGNOSIS — Z87891 Personal history of nicotine dependence: Secondary | ICD-10-CM | POA: Insufficient documentation

## 2022-03-16 DIAGNOSIS — K649 Unspecified hemorrhoids: Secondary | ICD-10-CM

## 2022-03-16 DIAGNOSIS — Z6841 Body Mass Index (BMI) 40.0 and over, adult: Secondary | ICD-10-CM | POA: Insufficient documentation

## 2022-03-16 DIAGNOSIS — K648 Other hemorrhoids: Secondary | ICD-10-CM | POA: Insufficient documentation

## 2022-03-16 DIAGNOSIS — K5790 Diverticulosis of intestine, part unspecified, without perforation or abscess without bleeding: Secondary | ICD-10-CM

## 2022-03-16 DIAGNOSIS — K625 Hemorrhage of anus and rectum: Secondary | ICD-10-CM

## 2022-03-16 DIAGNOSIS — K635 Polyp of colon: Secondary | ICD-10-CM

## 2022-03-16 DIAGNOSIS — K219 Gastro-esophageal reflux disease without esophagitis: Secondary | ICD-10-CM | POA: Insufficient documentation

## 2022-03-16 DIAGNOSIS — K921 Melena: Secondary | ICD-10-CM | POA: Insufficient documentation

## 2022-03-16 DIAGNOSIS — F319 Bipolar disorder, unspecified: Secondary | ICD-10-CM | POA: Insufficient documentation

## 2022-03-16 DIAGNOSIS — D125 Benign neoplasm of sigmoid colon: Secondary | ICD-10-CM | POA: Insufficient documentation

## 2022-03-16 DIAGNOSIS — K5900 Constipation, unspecified: Secondary | ICD-10-CM | POA: Insufficient documentation

## 2022-03-16 HISTORY — PX: POLYPECTOMY: SHX5525

## 2022-03-16 HISTORY — PX: COLONOSCOPY WITH PROPOFOL: SHX5780

## 2022-03-16 LAB — HM COLONOSCOPY

## 2022-03-16 SURGERY — COLONOSCOPY WITH PROPOFOL
Anesthesia: General

## 2022-03-16 MED ORDER — LACTATED RINGERS IV SOLN: INTRAVENOUS | Status: DC

## 2022-03-16 MED ORDER — PROPOFOL 10 MG/ML IV BOLUS
INTRAVENOUS | Status: DC | PRN
  Administered 2022-03-16: 100 mg via INTRAVENOUS

## 2022-03-16 MED ORDER — LIDOCAINE HCL (CARDIAC) PF 100 MG/5ML IV SOSY
PREFILLED_SYRINGE | INTRAVENOUS | Status: DC | PRN
  Administered 2022-03-16: 50 mg via INTRAVENOUS

## 2022-03-16 MED ORDER — PROPOFOL 500 MG/50ML IV EMUL
INTRAVENOUS | Status: DC | PRN
  Administered 2022-03-16: 200 ug/kg/min via INTRAVENOUS

## 2022-03-16 NOTE — Transfer of Care (Signed)
Immediate Anesthesia Transfer of Care Note  Patient: [name]  Procedure(s) Performed: COLONOSCOPY WITH PROPOFOL POLYPECTOMY  Patient Location: Short Stay  Anesthesia Type:General  Level of Consciousness: awake  Airway & Oxygen Therapy: Patient Spontanous Breathing  Post-op Assessment: Report given to RN and Post -op Vital signs reviewed and stable  Post vital signs: Reviewed and stable  Last Vitals:  Vitals Value Taken Time  BP 115/57 03/16/22 1201  Temp 36.4 C 03/16/22 1201  Pulse 101 03/16/22 1201  Resp 14 03/16/22 1201  SpO2 96 % 03/16/22 1201    Last Pain:  Vitals:   03/16/22 1201  TempSrc: Oral  PainSc: 0-No pain         Complications: No notable events documented.

## 2022-03-16 NOTE — Interval H&P Note (Signed)
History and Physical Interval Note:  03/16/2022 11:16 AM  [name]  has presented today for surgery, with the diagnosis of Rectal Bleeding.  The various methods of treatment have been discussed with the patient and family. After consideration of risks, benefits and other options for treatment, the patient has consented to  Procedure(s) with comments: COLONOSCOPY WITH PROPOFOL (N/A) - 1245 ASA 1 as a surgical intervention.  The patient's history has been reviewed, patient examined, no change in status, stable for surgery.  I have reviewed the patient's chart and labs.  Questions were answered to the patient's satisfaction.     [name]

## 2022-03-16 NOTE — Anesthesia Procedure Notes (Signed)
Date/Time: 03/16/2022 11:53 AM  Performed by: Orlie Dakin, CRNAPre-anesthesia Checklist: Patient identified, Emergency Drugs available, Suction available and Patient being monitored Patient Re-evaluated:Patient Re-evaluated prior to induction Oxygen Delivery Method: Nasal cannula Induction Type: IV induction Placement Confirmation: positive ETCO2

## 2022-03-16 NOTE — Discharge Instructions (Addendum)
You are being discharged to home.  Resume your previous diet.  We are waiting for your pathology results.  Your physician has recommended a repeat colonoscopy for surveillance based on pathology results.  Continue Linzess 145 mcg qday.

## 2022-03-16 NOTE — Anesthesia Preprocedure Evaluation (Signed)
Anesthesia Evaluation  Patient identified by MRN, date of birth, ID band Patient awake    Reviewed: Allergy & Precautions, H&P , NPO status , Patient's Chart, lab work & pertinent test results, reviewed documented beta blocker date and time   Airway Mallampati: II  TM Distance: >3 FB Neck ROM: full    Dental no notable dental hx.    Pulmonary neg pulmonary ROS, former smoker,    Pulmonary exam normal breath sounds clear to auscultation       Cardiovascular Exercise Tolerance: Good negative cardio ROS   Rhythm:regular Rate:Normal     Neuro/Psych PSYCHIATRIC DISORDERS Depression Bipolar Disorder negative neurological ROS     GI/Hepatic negative GI ROS, Neg liver ROS,   Endo/Other  Morbid obesity  Renal/GU negative Renal ROS  negative genitourinary   Musculoskeletal   Abdominal   Peds  Hematology negative hematology ROS (+)   Anesthesia Other Findings   Reproductive/Obstetrics negative OB ROS                             Anesthesia Physical Anesthesia Plan  ASA: 3  Anesthesia Plan: General   Post-op Pain Management:    Induction:   PONV Risk Score and Plan: Propofol infusion  Airway Management Planned:   Additional Equipment:   Intra-op Plan:   Post-operative Plan:   Informed Consent: I have reviewed the patients History and Physical, chart, labs and discussed the procedure including the risks, benefits and alternatives for the proposed anesthesia with the patient or authorized representative who has indicated his/her understanding and acceptance.     Dental Advisory Given  Plan Discussed with: CRNA  Anesthesia Plan Comments:         Anesthesia Quick Evaluation

## 2022-03-16 NOTE — Op Note (Signed)
[facility] Patient Name: [name] Procedure Date: 03/16/2022 11:15 AM MRN: [id number] Date of Birth: 1976/01/20 Attending MD: Maylon Peppers ,  CSN: [id number] Age: 46 Admit Type: Outpatient Procedure:                Colonoscopy Indications:              Rectal bleeding, Constipation Providers:                Maylon Peppers, Janeece Riggers, RN, Raphael Gibney,                            Technician Referring MD:              Medicines:                Monitored Anesthesia Care Complications:            No immediate complications. Estimated Blood Loss:     Estimated blood loss: none. Procedure:                Pre-Anesthesia Assessment:                           - Prior to the procedure, a History and Physical                            was performed, and patient medications, allergies                            and sensitivities were reviewed. The patient's                            tolerance of previous anesthesia was reviewed.                           - The risks and benefits of the procedure and the                            sedation options and risks were discussed with the                            patient. All questions were answered and informed                            consent was obtained.                           - ASA Grade Assessment: II - A patient with mild                            systemic disease.                           After obtaining informed consent, the colonoscope                            was passed under direct vision. Throughout the  procedure, the patient's blood pressure, pulse, and                            oxygen saturations were monitored continuously. The                            [id number]-HQ190L ([id number]) scope was introduced through                            the anus and advanced to the the terminal ileum.                            The colonoscopy was performed without difficulty.                             The patient tolerated the procedure well. The                            quality of the bowel preparation was excellent. Scope In: 11:27:30 AM Scope Out: 11:59:26 AM Scope Withdrawal Time: 0 hours 27 minutes 45 seconds  Total Procedure Duration: 0 hours 31 minutes 56 seconds  Findings:      The perianal and digital rectal examinations were normal.      The terminal ileum appeared normal.      A 4 mm polyp was found in the sigmoid colon. The polyp was sessile. The       polyp was removed with a cold snare. Resection and retrieval were       complete.      A few small-mouthed diverticula were found in the sigmoid colon.      Non-bleeding internal hemorrhoids were found during retroflexion. The       hemorrhoids were small. Impression:               - The examined portion of the ileum was normal.                           - One 4 mm polyp in the sigmoid colon, removed with                            a cold snare. Resected and retrieved.                           - Diverticulosis in the sigmoid colon.                           - Non-bleeding internal hemorrhoids. Moderate Sedation:      Per Anesthesia Care Recommendation:           - Discharge patient to home (ambulatory).                           - Resume previous diet.                           - Await pathology results.                           -  Repeat colonoscopy for surveillance based on                            pathology results.                           - Continue Linzess 145 mcg qday. Procedure Code(s):        --- Professional ---                           [id number], Colonoscopy, flexible; with removal of                            tumor(s), polyp(s), or other lesion(s) by snare                            technique Diagnosis Code(s):        --- Professional ---                           K64.8, Other hemorrhoids                           K63.5, Polyp of colon                           K62.5, Hemorrhage of anus and rectum                            K59.00, Constipation, unspecified                           K57.30, Diverticulosis of large intestine without                            perforation or abscess without bleeding CPT copyright 2019 American Medical Association. All rights reserved. The codes documented in this report are preliminary and upon coder review may  be revised to meet current compliance requirements. Maylon Peppers, MD Maylon Peppers,  03/16/2022 12:06:33 PM This report has been signed electronically. Number of Addenda: 0

## 2022-03-17 ENCOUNTER — Encounter (INDEPENDENT_AMBULATORY_CARE_PROVIDER_SITE_OTHER): Payer: Self-pay | Admitting: *Deleted

## 2022-03-17 LAB — SURGICAL PATHOLOGY

## 2022-03-17 NOTE — Anesthesia Postprocedure Evaluation (Signed)
Anesthesia Post Note  Patient: [name]  Procedure(s) Performed: COLONOSCOPY WITH PROPOFOL POLYPECTOMY  Patient location during evaluation: Phase II Anesthesia Type: General Level of consciousness: awake Pain management: pain level controlled Vital Signs Assessment: post-procedure vital signs reviewed and stable Respiratory status: spontaneous breathing and respiratory function stable Cardiovascular status: blood pressure returned to baseline and stable Postop Assessment: no headache and no apparent nausea or vomiting Anesthetic complications: no Comments: Late entry   No notable events documented.   Last Vitals:  Vitals:   03/16/22 1110 03/16/22 1201  BP: (!) 146/75 (!) 115/57  Pulse: 83 (!) 101  Resp: (!) 21 14  Temp: (!) 36.4 C 36.4 C  SpO2: 95% 96%    Last Pain:  Vitals:   03/16/22 1201  TempSrc: Oral  PainSc: 0-No pain                 Louann Sjogren

## 2022-03-21 ENCOUNTER — Encounter (HOSPITAL_COMMUNITY): Payer: Self-pay | Admitting: Gastroenterology

## 2022-03-28 ENCOUNTER — Encounter: Payer: Self-pay | Admitting: Surgery

## 2022-03-28 ENCOUNTER — Ambulatory Visit (INDEPENDENT_AMBULATORY_CARE_PROVIDER_SITE_OTHER): Payer: Self-pay | Admitting: Surgery

## 2022-03-28 VITALS — BP 172/105 | HR 86 | Temp 98.2°F | Resp 16 | Ht 61.0 in | Wt 233.0 lb

## 2022-03-28 DIAGNOSIS — N6002 Solitary cyst of left breast: Secondary | ICD-10-CM

## 2022-03-28 NOTE — Addendum Note (Signed)
Addendum  created 03/28/22 1011 by Karna Dupes, CRNA   Intraprocedure Staff edited

## 2022-03-28 NOTE — Progress Notes (Signed)
[facility] History and Physical  Reason for Referral:*** Referring Physician: ***  Chief Complaint   New Patient (Initial Visit)     [name] is a 46 y.o. female.  HPI: ***.  The *** started *** and has had a duration of ***.  It is associated with ***.  The *** is improved with ***, and is made worse with ***.    Quality*** Context***  Past Medical History:  Diagnosis Date  . Back complaints   . Bipolar 1 disorder ([facility])   . Depression     Past Surgical History:  Procedure Laterality Date  . ABDOMINAL HYSTERECTOMY    . CARPAL TUNNEL RELEASE Right 2007  . CHOLECYSTECTOMY    . COLONOSCOPY WITH PROPOFOL N/A 03/16/2022   Procedure: COLONOSCOPY WITH PROPOFOL;  Surgeon: Dolores Frame, MD;  Location: [facility];  Service: Gastroenterology;  Laterality: N/A;  1245 ASA 1  . GALLBLADDER SURGERY    . OVARIAN CYST REMOVAL    . PARTIAL HYSTERECTOMY    . POLYPECTOMY  03/16/2022   Procedure: POLYPECTOMY;  Surgeon: Dolores Frame, MD;  Location: [facility];  Service: Gastroenterology;;  . TUBAL LIGATION      Family History  Problem Relation Age of Onset  . Cancer Mother   . Kidney cancer Mother   . Hypertension Mother   . Diabetes Mother   . Heart disease Father   . Hypertension Father   . Prostate cancer Father     Social History   Tobacco Use  . Smoking status: Former    Types: Cigarettes    Quit date: 05/22/2015    Years since quitting: 6.8    Passive exposure: Past  . Smokeless tobacco: Never  Vaping Use  . Vaping Use: Former  Substance Use Topics  . Alcohol use: Not Currently    Comment: history social etoh  . Drug use: Yes    Types: 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    Medications: {medication reviewed/display:3041432} Allergies as of 03/28/2022       Reactions   Cortisone Swelling, Rash   Cortisone shot        Medication List        Accurate as of March 28, 2022 10:42 AM.  If you have any questions, ask your nurse or doctor.          cyclobenzaprine 10 MG tablet Commonly known as: FLEXERIL Take 10 mg by mouth 3 (three) times daily as needed for muscle spasms.   Linzess 145 MCG Caps capsule Generic drug: linaclotide Take 145 mcg by mouth as needed.   TYLENOL ARTHRITIS PAIN PO Take 650 mg by mouth as needed.         ROS:  {Review of Systems:30496}  Blood pressure (!) 172/105, pulse 86, temperature 98.2 F (36.8 C), temperature source Oral, resp. rate 16, height 5\' 1"  (1.549 m), weight 233 lb (105.7 kg), SpO2 93 %. Physical Exam  Results: No results found for this or any previous visit (from the past 48 hour(s)).  No results found.   Assessment & Plan:  [name] is a 46 y.o. female with *** -*** -*** -Follow up ***  All questions were answered to the satisfaction of the patient and family***.  The risk and benefits of *** were discussed including but not limited to ***.  After careful consideration, [name] has decided to ***.    Yevonne Yokum A Dirk Vanaman 03/28/2022, 10:42  AM   Theophilus Kinds, DO [facility] [address] [place], [place] [address] [phone] (office)

## 2022-03-29 NOTE — H&P (Signed)
[facility] History and Physical   Reason for Referral: Left breast cyst Referring Physician: Soyla Dryer, PA-C   Chief Complaint   New Patient (Initial Visit)        [name] is a 46 y.o. female.  HPI: Patient presents for evaluation of her left breast cyst.  She first noticed it at the beginning of this year.  She has a history of cysts all over her body, and she normally is able to squeeze them, and able to drain and then get better.  However, with this left breast cyst, every time she attempted to squeeze it, she was unable to adequately drain the area.  When this area initially flared, she was given antibiotics which only moderately helped the area.  It was last flaring 3 to 4 weeks ago.  She denies ever having an I&D to this area.  She will apply warm compresses to help alleviate the erythema and tenderness.  She denies any fevers or chills. The patient has no history of any masses, lumps, bumps, nipple changes or discharge. She had menarche at age 67, and her first pregnancy at age 23. She is G4P4. She did breastfeed her first 2 children.  She has a family history of breast cancer in her maternal great-grandmother, her maternal great aunt, and her grandmother.  She underwent menopause in her 59s after hysterectomy.  She has never had any previous biopsies or concerning areas on mammogram.  She has not had any chest radiation.  Her past medical history is significant for arthritis and constipation.  Her surgical history significant for hysterectomy, cholecystectomy, right axillary hidradenitis excision, tubal ligation, and ovarian cyst removal.  She denies use of tobacco and alcohol products.  She does smoke marijuana every other day to alleviate her arthritis pains.         Past Medical History:  Diagnosis Date   Back complaints     Bipolar 1 disorder ([place])     Depression             Past Surgical History:  Procedure Laterality Date   ABDOMINAL  HYSTERECTOMY       CARPAL TUNNEL RELEASE Right 2007   CHOLECYSTECTOMY       COLONOSCOPY WITH PROPOFOL N/A 03/16/2022    Procedure: COLONOSCOPY WITH PROPOFOL;  Surgeon: Harvel Quale, MD;  Location: [facility];  Service: Gastroenterology;  Laterality: N/A;  1245 ASA 1   GALLBLADDER SURGERY       OVARIAN CYST REMOVAL       PARTIAL HYSTERECTOMY       POLYPECTOMY   03/16/2022    Procedure: POLYPECTOMY;  Surgeon: Montez Morita, Quillian Quince, MD;  Location: [facility];  Service: Gastroenterology;;   TUBAL LIGATION               Family History  Problem Relation Age of Onset   Cancer Mother     Kidney cancer Mother     Hypertension Mother     Diabetes Mother     Heart disease Father     Hypertension Father     Prostate cancer Father        Social History         Tobacco Use   Smoking status: Former      Types: Cigarettes      Quit date: 05/22/2015      Years since quitting: 6.8      Passive exposure: Past   Smokeless tobacco: Never  Vaping Use  Vaping Use: Former  Substance Use Topics   Alcohol use: Not Currently      Comment: history social etoh   Drug use: Yes      Types: Marijuana      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 Allergies as of 03/28/2022         Reactions    Cortisone Swelling, Rash    Cortisone shot            Medication List           Accurate as of March 28, 2022 10:42 AM. If you have any questions, ask your nurse or doctor.              cyclobenzaprine 10 MG tablet Commonly known as: FLEXERIL Take 10 mg by mouth 3 (three) times daily as needed for muscle spasms.    Linzess 145 MCG Caps capsule Generic drug: linaclotide Take 145 mcg by mouth as needed.    TYLENOL ARTHRITIS PAIN PO Take 650 mg by mouth as needed.               ROS:  Constitutional: negative for chills, fatigue, and fevers Eyes: negative for visual disturbance and pain Ears,  nose, mouth, throat, and face: negative for ear drainage, sore throat, and sinus problems Respiratory: positive for shortness of breath, negative for cough and wheezing Cardiovascular: negative for chest pain and palpitations Gastrointestinal: positive for reflux symptoms, negative for abdominal pain, nausea, and vomiting Genitourinary:positive for frequency, negative for dysuria and urinary retention Integument/breast: negative for dryness and rash Hematologic/lymphatic: negative for bleeding and lymphadenopathy Musculoskeletal:positive for back pain, neck pain, and joint pain Neurological: negative for dizziness, tremors, and numbness Endocrine: negative for temperature intolerance   Blood pressure (!) 172/105, pulse 86, temperature 98.2 F (36.8 C), temperature source Oral, resp. rate 16, height '5\' 1"'$  (1.549 m), weight 233 lb (105.7 kg), SpO2 93 %. Physical Exam Vitals reviewed.  Constitutional:      Appearance: Normal appearance.  HENT:     Head: Normocephalic and atraumatic.  Eyes:     Extraocular Movements: Extraocular movements intact.     Pupils: Pupils are equal, round, and reactive to light.  Cardiovascular:     Rate and Rhythm: Normal rate.  Pulmonary:     Effort: Pulmonary effort is normal.  Chest:  Breasts:    Right: No swelling, inverted nipple, mass, nipple discharge, skin change or tenderness.     Left: Mass and tenderness present. No swelling, inverted nipple, nipple discharge or skin change.     Comments: 1 cm palpable area at the 8 to 9 o'clock position just medial to the left nipple; bilateral axillary scarring noted with previous right axillary cicatrix noted Abdominal:     General: There is no distension.     Palpations: Abdomen is soft.     Tenderness: There is no abdominal tenderness.  Musculoskeletal:        General: Normal range of motion.     Cervical back: Normal range of motion.  Lymphadenopathy:     Upper Body:     Right upper body: No  supraclavicular or axillary adenopathy.     Left upper body: No supraclavicular or axillary adenopathy.  Skin:    General: Skin is warm and dry.  Neurological:     General: No focal deficit present.     Mental Status: She is alert and oriented to  person, place, and time.  Psychiatric:        Mood and Affect: Mood normal.        Behavior: Behavior normal.        Results: Bilateral diagnostic mammogram (09/20/2021): IMPRESSION: 1. Sebaceous cyst at site of palpable concern in the inner left breast.   * 2.  No findings of malignancy in either breast.   RECOMMENDATION: 1. Recommend further management of the left breast sebaceous cyst be based on clinical assessment.   2.  Screening mammogram in one year.(Code:SM-B-01Y)   I have discussed the findings and recommendations with the patient. If applicable, a reminder letter will be sent to the patient regarding the next appointment.   BI-RADS CATEGORY  2: Benign.   Left breast ultrasound (09/20/2021): IMPRESSION: 1. Sebaceous cyst at site of palpable concern in the inner left breast.   * 2.  No findings of malignancy in either breast.   RECOMMENDATION: 1. Recommend further management of the left breast sebaceous cyst be based on clinical assessment.   2.  Screening mammogram in one year.(Code:SM-B-01Y)   I have discussed the findings and recommendations with the patient. If applicable, a reminder letter will be sent to the patient regarding the next appointment.   BI-RADS CATEGORY  2: Benign.   Assessment & Plan:  [name] is a 46 y.o. female who presents for evaluation of left breast mass/cyst.   -I explained the patient's imaging findings, which are consistent with a sebaceous cyst of the left breast.  Given her personal history of cysts, hidradenitis, and flares of this area, I recommend excision of this lesion.  We will plan to send it to pathology for evaluation postoperatively -The risk and benefits of  left breast mass excision were discussed including but not limited to bleeding, infection, injury to surrounding structures, need for additional procedure, failure of left breast to map for sentinel lymph node biopsy if diagnosed with cancer in the future, and seroma formation.  After careful consideration, [name] has decided to proceed with left breast mass excision. -Patient tentatively scheduled for surgery on 8/21 -Information regarding epidermoid/sebaceous cysts and the removal provided to the patient -Baker Janus risk model predicts a 5-year risk of breast cancer in this patient to be 0.6%, and a lifetime risk of 6.8%   All questions were answered to the satisfaction of the patient.   Graciella Freer, DO [facility] [address] [place], [place] [address] [phone] (office)

## 2022-04-05 ENCOUNTER — Encounter: Payer: Self-pay | Admitting: Physical Medicine and Rehabilitation

## 2022-04-05 ENCOUNTER — Ambulatory Visit (INDEPENDENT_AMBULATORY_CARE_PROVIDER_SITE_OTHER): Payer: Self-pay | Admitting: Physical Medicine and Rehabilitation

## 2022-04-05 DIAGNOSIS — R202 Paresthesia of skin: Secondary | ICD-10-CM

## 2022-04-05 NOTE — Progress Notes (Signed)
Pt state left elbow weakness and numbness. Pt state she has been dropping items and able to hold items. Pt state she right handed. Pt state she takes over the counter pain meds to help ease her pain.   Numeric Pain Rating Scale and Functional Assessment Average Pain 8   In the last MONTH (on 0-10 scale) has pain interfered with the following?  1. General activity like being  able to carry out your everyday physical activities such as walking, climbing stairs, carrying groceries, or moving a chair?  Rating(10)    -BT,

## 2022-04-06 ENCOUNTER — Encounter (HOSPITAL_COMMUNITY): Admission: RE | Admit: 2022-04-06 | Payer: Medicaid Other | Source: Ambulatory Visit

## 2022-04-06 NOTE — Procedures (Signed)
EMG & NCV Findings: Evaluation of the left median motor nerve showed prolonged distal onset latency (5.6 ms) and decreased conduction velocity (Elbow-Wrist, 45 m/s).  The left median (across palm) sensory nerve showed prolonged distal peak latency (Wrist, 6.5 ms), reduced amplitude (2.9 V), and prolonged distal peak latency (Palm, 4.8 ms).  All remaining nerves (as indicated in the following tables) were within normal limits.    All examined muscles (as indicated in the following table) showed no evidence of electrical instability.    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   There is no significant electrodiagnostic evidence of any other focal nerve entrapment, brachial plexopathy or cervical radiculopathy.   Recommendations: 1.  Follow-up with referring physician. 2.  Continue current management of symptoms. 3.  Continue use of resting splint at night-time and as needed during the day. 4.  Suggest surgical evaluation.  ___________________________ Laurence Spates FAAPMR Board Certified, American Board of Physical Medicine and Rehabilitation    Nerve Conduction Studies Anti Sensory Summary Table   Stim Site NR Peak (ms) Norm Peak (ms) P-T Amp (V) Norm P-T Amp Site1 Site2 Delta-P (ms) Dist (cm) Vel (m/s) Norm Vel (m/s)  Left Median Acr Palm Anti Sensory (2nd Digit)  30.6C  Wrist    *6.5 <3.6 *2.9 >10 Wrist Palm 1.7 0.0    Palm    *4.8 <2.0 14.6         Left Radial Anti Sensory (Base 1st Digit)  29.9C  Wrist    1.8 <3.1 41.7  Wrist Base 1st Digit 1.8 0.0    Left Ulnar Anti Sensory (5th Digit)  30.9C  Wrist    3.1 <3.7 19.9 >15.0 Wrist 5th Digit 3.1 14.0 45 >38   Motor Summary Table   Stim Site NR Onset (ms) Norm Onset (ms) O-P Amp (mV) Norm O-P Amp Site1 Site2 Delta-0 (ms) Dist (cm) Vel (m/s) Norm Vel (m/s)  Left  Median Motor (Abd Poll Brev)  30.6C  Wrist    *5.6 <4.2 6.0 >5 Elbow Wrist 4.3 19.3 *45 >50  Elbow    9.9  5.9         Left Ulnar Motor (Abd Dig Min)  30.9C  Wrist    2.8 <4.2 7.9 >3 B Elbow Wrist 3.1 17.5 56 >53  B Elbow    5.9  8.4  A Elbow B Elbow 1.6 10.0 63 >53  A Elbow    7.5  5.6          EMG   Side Muscle Nerve Root Ins Act Fibs Psw Amp Dur Poly Recrt Int Fraser Din Comment  Left Abd Poll Brev Median C8-T1 Nml Nml Nml Nml Nml 0 Nml Nml   Left 1stDorInt Ulnar C8-T1 Nml Nml Nml Nml Nml 0 Nml Nml   Left PronatorTeres Median C6-7 Nml Nml Nml Nml Nml 0 Nml Nml   Left Biceps Musculocut C5-6 Nml Nml Nml Nml Nml 0 Nml Nml   Left Deltoid Axillary C5-6 Nml Nml Nml Nml Nml 0 Nml Nml     Nerve Conduction Studies Anti Sensory Left/Right Comparison   Stim Site L Lat (ms) R Lat (ms) L-R Lat (ms) L Amp (V) R Amp (V) L-R Amp (%) Site1 Site2 L Vel (m/s) R Vel (m/s) L-R Vel (m/s)  Median Acr Palm Anti Sensory (2nd Digit)  30.6C  Wrist *6.5   *2.9  Wrist Palm     Palm *4.8   14.6         Radial Anti Sensory (Base 1st Digit)  29.9C  Wrist 1.8   41.7   Wrist Base 1st Digit     Ulnar Anti Sensory (5th Digit)  30.9C  Wrist 3.1   19.9   Wrist 5th Digit 45     Motor Left/Right Comparison   Stim Site L Lat (ms) R Lat (ms) L-R Lat (ms) L Amp (mV) R Amp (mV) L-R Amp (%) Site1 Site2 L Vel (m/s) R Vel (m/s) L-R Vel (m/s)  Median Motor (Abd Poll Brev)  30.6C  Wrist *5.6   6.0   Elbow Wrist *45    Elbow 9.9   5.9         Ulnar Motor (Abd Dig Min)  30.9C  Wrist 2.8   7.9   B Elbow Wrist 56    B Elbow 5.9   8.4   A Elbow B Elbow 63    A Elbow 7.5   5.6            Waveforms:

## 2022-04-06 NOTE — Progress Notes (Signed)
[name] - 46 y.o. female MRN [id number]  Date of birth: 1976/04/27  Office Visit Note: Visit Date: 04/05/2022 PCP: Soyla Dryer, PA-C Referred by: Carole Civil, MD  Subjective: Chief Complaint  Patient presents with   Left Elbow - Numbness, Weakness, Pain   HPI:  [name] is a 47 y.o. female who comes in today at the request of Dr. Arther Abbott for electrodiagnostic study of the Left upper extremities.  Patient is Right hand dominant.  She reports having had prior carpal tunnel release and ulnar nerve release on the right many many years ago.  She reports similar ongoing symptoms now with chronic worsening pain numbness and tingling in the left hand somewhat globally.  She does feel like she is getting some weakness in the left hand and dropping objects.  She gets some symptoms with using the arm and driving etc.  No frank radicular symptoms.  No recent electrodiagnostic studies.  Dr. Aline Brochure felt like this may be a cubital tunnel syndrome.   ROS Otherwise per HPI.  Assessment & Plan: Visit Diagnoses:    ICD-10-CM   1. Paresthesia of left arm  R20.2 NCV with EMG (electromyography)      Plan: Impression: The above electrodiagnostic study is ABNORMAL and reveals evidence of a moderate to  severe left median nerve entrapment at the wrist (carpal tunnel syndrome) affecting sensory and motor components.   There is no significant electrodiagnostic evidence of any other focal nerve entrapment, brachial plexopathy or cervical radiculopathy.   Recommendations: 1.  Follow-up with referring physician. 2.  Continue current management of symptoms. 3.  Continue use of resting splint at night-time and as needed during the day. 4.  Suggest surgical evaluation.  Meds & Orders: No orders of the defined types were placed in this encounter.   Orders Placed This Encounter  Procedures   NCV with EMG (electromyography)    Follow-up: Return in about 2 weeks  (around 04/19/2022) for Arther Abbott, MD.   Procedures: No procedures performed  EMG & NCV Findings: Evaluation of the left median motor nerve showed prolonged distal onset latency (5.6 ms) and decreased conduction velocity (Elbow-Wrist, 45 m/s).  The left median (across palm) sensory nerve showed prolonged distal peak latency (Wrist, 6.5 ms), reduced amplitude (2.9 V), and prolonged distal peak latency (Palm, 4.8 ms).  All remaining nerves (as indicated in the following tables) were within normal limits.    All examined muscles (as indicated in the following table) showed no evidence of electrical instability.    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   There is no significant electrodiagnostic evidence of any other focal nerve entrapment, brachial plexopathy or cervical radiculopathy.   Recommendations: 1.  Follow-up with referring physician. 2.  Continue current management of symptoms. 3.  Continue use of resting splint at night-time and as needed during the day. 4.  Suggest surgical evaluation.  ___________________________ Laurence Spates FAAPMR Board Certified, American Board of Physical Medicine and Rehabilitation    Nerve Conduction Studies Anti Sensory Summary Table   Stim Site NR Peak (ms) Norm Peak (ms) P-T Amp (V) Norm P-T Amp Site1 Site2 Delta-P (ms) Dist (cm) Vel (m/s) Norm Vel (m/s)  Left Median Acr Palm Anti Sensory (2nd Digit)  30.6C  Wrist    *6.5 <3.6 *2.9 >10 Wrist Palm 1.7 0.0  Palm    *4.8 <2.0 14.6         Left Radial Anti Sensory (Base 1st Digit)  29.9C  Wrist    1.8 <3.1 41.7  Wrist Base 1st Digit 1.8 0.0    Left Ulnar Anti Sensory (5th Digit)  30.9C  Wrist    3.1 <3.7 19.9 >15.0 Wrist 5th Digit 3.1 14.0 45 >38   Motor Summary Table   Stim Site NR Onset (ms) Norm Onset (ms) O-P  Amp (mV) Norm O-P Amp Site1 Site2 Delta-0 (ms) Dist (cm) Vel (m/s) Norm Vel (m/s)  Left Median Motor (Abd Poll Brev)  30.6C  Wrist    *5.6 <4.2 6.0 >5 Elbow Wrist 4.3 19.3 *45 >50  Elbow    9.9  5.9         Left Ulnar Motor (Abd Dig Min)  30.9C  Wrist    2.8 <4.2 7.9 >3 B Elbow Wrist 3.1 17.5 56 >53  B Elbow    5.9  8.4  A Elbow B Elbow 1.6 10.0 63 >53  A Elbow    7.5  5.6          EMG   Side Muscle Nerve Root Ins Act Fibs Psw Amp Dur Poly Recrt Int Fraser Din Comment  Left Abd Poll Brev Median C8-T1 Nml Nml Nml Nml Nml 0 Nml Nml   Left 1stDorInt Ulnar C8-T1 Nml Nml Nml Nml Nml 0 Nml Nml   Left PronatorTeres Median C6-7 Nml Nml Nml Nml Nml 0 Nml Nml   Left Biceps Musculocut C5-6 Nml Nml Nml Nml Nml 0 Nml Nml   Left Deltoid Axillary C5-6 Nml Nml Nml Nml Nml 0 Nml Nml     Nerve Conduction Studies Anti Sensory Left/Right Comparison   Stim Site L Lat (ms) R Lat (ms) L-R Lat (ms) L Amp (V) R Amp (V) L-R Amp (%) Site1 Site2 L Vel (m/s) R Vel (m/s) L-R Vel (m/s)  Median Acr Palm Anti Sensory (2nd Digit)  30.6C  Wrist *6.5   *2.9   Wrist Palm     Palm *4.8   14.6         Radial Anti Sensory (Base 1st Digit)  29.9C  Wrist 1.8   41.7   Wrist Base 1st Digit     Ulnar Anti Sensory (5th Digit)  30.9C  Wrist 3.1   19.9   Wrist 5th Digit 45     Motor Left/Right Comparison   Stim Site L Lat (ms) R Lat (ms) L-R Lat (ms) L Amp (mV) R Amp (mV) L-R Amp (%) Site1 Site2 L Vel (m/s) R Vel (m/s) L-R Vel (m/s)  Median Motor (Abd Poll Brev)  30.6C  Wrist *5.6   6.0   Elbow Wrist *45    Elbow 9.9   5.9         Ulnar Motor (Abd Dig Min)  30.9C  Wrist 2.8   7.9   B Elbow Wrist 56    B Elbow 5.9   8.4   A Elbow B Elbow 63    A Elbow 7.5   5.6            Waveforms:             Clinical History: No specialty comments available.     Objective:  VS:  HT:    WT:   BMI:     BP:   HR: bpm  TEMP: ( )  RESP:  Physical Exam Musculoskeletal:  General: No swelling, tenderness or  deformity.     Comments: Inspection reveals no atrophy of the bilateral APB or FDI or hand intrinsics. There is no swelling, color changes, allodynia or dystrophic changes. There is 5 out of 5 strength in the bilateral wrist extension, finger abduction and long finger flexion. There is intact sensation to light touch in all dermatomal and peripheral nerve distributions. There is a negative Froment's test bilaterally. There is a positive Phalen's test on the left. There is a negative Hoffmann's test bilaterally.  Skin:    General: Skin is warm and dry.     Findings: No erythema or rash.  Neurological:     General: No focal deficit present.     Mental Status: She is alert and oriented to person, place, and time.     Motor: No weakness or abnormal muscle tone.     Coordination: Coordination normal.  Psychiatric:        Mood and Affect: Mood normal.        Behavior: Behavior normal.      Imaging: No results found.

## 2022-04-10 ENCOUNTER — Encounter (HOSPITAL_COMMUNITY): Admission: RE | Payer: Self-pay | Source: Home / Self Care

## 2022-04-10 ENCOUNTER — Ambulatory Visit (HOSPITAL_COMMUNITY): Admission: RE | Admit: 2022-04-10 | Payer: Medicaid Other | Source: Home / Self Care | Admitting: Surgery

## 2022-04-10 SURGERY — EXCISION OF BREAST BIOPSY
Anesthesia: Choice | Laterality: Left

## 2022-05-01 ENCOUNTER — Other Ambulatory Visit (HOSPITAL_COMMUNITY)
Admission: RE | Admit: 2022-05-01 | Discharge: 2022-05-01 | Disposition: A | Discharge status: Home / Self Care | Payer: Self-pay | Source: Ambulatory Visit | Attending: Physician Assistant | Admitting: Physician Assistant

## 2022-05-01 ENCOUNTER — Ambulatory Visit (HOSPITAL_COMMUNITY)
Admission: RE | Admit: 2022-05-01 | Discharge: 2022-05-01 | Disposition: A | Discharge status: Home / Self Care | Payer: Medicaid Other | Source: Ambulatory Visit | Attending: Physician Assistant | Admitting: Physician Assistant

## 2022-05-01 ENCOUNTER — Ambulatory Visit: Payer: Medicaid Other | Admitting: Physician Assistant

## 2022-05-01 ENCOUNTER — Encounter: Payer: Self-pay | Admitting: Physician Assistant

## 2022-05-01 VITALS — BP 147/93 | HR 80 | Temp 97.8°F

## 2022-05-01 DIAGNOSIS — E785 Hyperlipidemia, unspecified: Secondary | ICD-10-CM | POA: Insufficient documentation

## 2022-05-01 DIAGNOSIS — M2550 Pain in unspecified joint: Secondary | ICD-10-CM | POA: Insufficient documentation

## 2022-05-01 DIAGNOSIS — N3281 Overactive bladder: Secondary | ICD-10-CM

## 2022-05-01 DIAGNOSIS — I10 Essential (primary) hypertension: Secondary | ICD-10-CM

## 2022-05-01 DIAGNOSIS — L749 Eccrine sweat disorder, unspecified: Secondary | ICD-10-CM | POA: Insufficient documentation

## 2022-05-01 DIAGNOSIS — R079 Chest pain, unspecified: Secondary | ICD-10-CM

## 2022-05-01 DIAGNOSIS — R0602 Shortness of breath: Secondary | ICD-10-CM

## 2022-05-01 DIAGNOSIS — E669 Obesity, unspecified: Secondary | ICD-10-CM

## 2022-05-01 DIAGNOSIS — R42 Dizziness and giddiness: Secondary | ICD-10-CM | POA: Insufficient documentation

## 2022-05-01 LAB — CBC
HCT: 41.5 % (ref 36.0–46.0)
Hemoglobin: 13.7 g/dL (ref 12.0–15.0)
MCH: 29.2 pg (ref 26.0–34.0)
MCHC: 33 g/dL (ref 30.0–36.0)
MCV: 88.5 fL (ref 80.0–100.0)
Platelets: 188 10*3/uL (ref 150–400)
RBC: 4.69 MIL/uL (ref 3.87–5.11)
RDW: 13.1 % (ref 11.5–15.5)
WBC: 5.2 10*3/uL (ref 4.0–10.5)
nRBC: 0 % (ref 0.0–0.2)

## 2022-05-01 LAB — LIPID PANEL
Cholesterol: 219 mg/dL — ABNORMAL HIGH (ref 0–200)
HDL: 39 mg/dL — ABNORMAL LOW (ref >40)
LDL Cholesterol: 144 mg/dL — ABNORMAL HIGH (ref 0–99)
Total CHOL/HDL Ratio: 5.6 RATIO
Triglycerides: 180 mg/dL — ABNORMAL HIGH (ref <150)
VLDL: 36 mg/dL (ref 0–40)

## 2022-05-01 LAB — COMPREHENSIVE METABOLIC PANEL
ALT: 42 U/L (ref 0–44)
AST: 22 U/L (ref 15–41)
Albumin: 4.1 g/dL (ref 3.5–5.0)
Alkaline Phosphatase: 71 U/L (ref 38–126)
Anion gap: 7 (ref 5–15)
BUN: 13 mg/dL (ref 6–20)
CO2: 24 mmol/L (ref 22–32)
Calcium: 8.6 mg/dL — ABNORMAL LOW (ref 8.9–10.3)
Chloride: 107 mmol/L (ref 98–111)
Creatinine, Ser: 0.59 mg/dL (ref 0.44–1.00)
GFR, Estimated: >60 mL/min (ref >60)
Glucose, Bld: 98 mg/dL (ref 70–99)
Potassium: 4.1 mmol/L (ref 3.5–5.1)
Sodium: 138 mmol/L (ref 135–145)
Total Bilirubin: 0.4 mg/dL (ref 0.3–1.2)
Total Protein: 6.9 g/dL (ref 6.5–8.1)

## 2022-05-01 LAB — SEDIMENTATION RATE: Sed Rate: 5 mm/hr (ref 0–22)

## 2022-05-01 LAB — TSH: TSH: 1.25 u[IU]/mL (ref 0.350–4.500)

## 2022-05-01 MED ORDER — ASPIRIN 81 MG PO TBEC: 81.0000 mg | DELAYED_RELEASE_TABLET | Freq: Every day | ORAL | 0 refills | Status: DC

## 2022-05-01 MED ORDER — METOPROLOL TARTRATE 25 MG PO TABS: 25.0000 mg | ORAL_TABLET | Freq: Two times a day (BID) | ORAL | 3 refills | Status: DC

## 2022-05-01 NOTE — Progress Notes (Signed)
BP (!) 147/93   Pulse 80   Temp 97.8 F (36.6 C)   SpO2 99%    Subjective:    Patient ID: [name], female    DOB: 1976/04/05, 46 y.o.   MRN: [id number]  HPI: [name] is a 46 y.o. female presenting on 05/01/2022 for Over Active Bladder   HPI  Chief Complaint  Patient presents with   Over Active Bladder     Pt is 56yoF who presents for follow up OAB.   She stopped the oxybutynin after reflection on the possible cognitive impairment risks associated with the medication.    She says Her whole body hurts when it's fixing to rain.   Her joints hurt so bad she can barely move.  Today is dry with no rain forecast (but it did rain overnight) and she says she is Not okay today.   The flexeril doesn't help.   This has been going on a couple months or maybe longer.    She saw orthopedist/dr Ernestina Patches for her arm issue and was diagnosed with carpal tunnel.   She has a splint that helps.     She also c/o profuse sweating at times.  She says It is constantly.  At night it's worse.  She isn't obviously sweating now but says she can feel it starting on her lower back starting to sweat.  Pt is unaware of menopausal status due to s/p hysterectomy.   She still has ovaries.     She had colonoscopy in July that showed a  polyp with recommendations to repeat in 5 years.   She had "an episode".     She Awakened from sleep "gasping for air" and right shoulder pain that radiated up into the neck and head .    The Pain lasted 20 minutes maybe.  When it went away she had emesis.    That was maybe 2 week ago.   It Only happened once.  She says that Now her chest feels heavy all the time.      She also says her dizziness is back.  she is using sudafed again to try to help with that.    She can't hear out of the left ear.   Her Last dose of sudafed was about 3 day ago.   She quit smoking 2016       Relevant past medical, surgical, family and social history reviewed and updated as  indicated. Interim medical history since our last visit reviewed. Allergies and medications reviewed and updated.   Current Outpatient Medications:    cyclobenzaprine (FLEXERIL) 10 MG tablet, Take 10 mg by mouth 3 (three) times daily as needed for muscle spasms., Disp: , Rfl:    linaclotide (LINZESS) 145 MCG CAPS capsule, Take by mouth as needed., Disp: , Rfl:    Acetaminophen (TYLENOL ARTHRITIS PAIN PO), Take 650 mg by mouth as needed. (Patient not taking: Reported on 05/01/2022), Disp: , Rfl:     Review of Systems  Per HPI unless specifically indicated above     Objective:    BP (!) 147/93   Pulse 80   Temp 97.8 F (36.6 C)   SpO2 99%   Wt Readings from Last 3 Encounters:  03/28/22 233 lb (105.7 kg)  03/16/22 233 lb (105.7 kg)  03/13/22 235 lb (106.6 kg)    Physical Exam Vitals reviewed.  Constitutional:      General: She is not in acute distress.    Appearance: She  is well-developed. She is obese. She is not toxic-appearing.  HENT:     Head: Normocephalic and atraumatic.     Right Ear: Tympanic membrane, ear canal and external ear normal.     Left Ear: Tympanic membrane, ear canal and external ear normal.  Neck:     Vascular: No carotid bruit.  Cardiovascular:     Rate and Rhythm: Normal rate and regular rhythm.  Pulmonary:     Effort: Pulmonary effort is normal.     Breath sounds: Normal breath sounds.  Abdominal:     General: Bowel sounds are normal.     Palpations: Abdomen is soft. There is no mass.     Tenderness: There is no abdominal tenderness.  Musculoskeletal:     Cervical back: Neck supple.     Right lower leg: No edema.     Left lower leg: No edema.  Lymphadenopathy:     Cervical: No cervical adenopathy.  Skin:    General: Skin is warm and dry.  Neurological:     Mental Status: She is alert and oriented to person, place, and time.  Psychiatric:        Behavior: Behavior normal.      No car bruits    EKG- NSR.  No st-t changes.   No  change compared with ekg done 05/24/21.       Assessment & Plan:    Encounter Diagnoses  Name Primary?   OAB (overactive bladder) Yes   SOB (shortness of breath)    Sweating abnormality    Generalized joint pain    Dizzy    Hypertension, unspecified type    Hyperlipidemia, unspecified hyperlipidemia type    Obesity, unspecified classification, unspecified obesity type, unspecified whether serious comorbidity present    Chest pain, unspecified type       arthralgias -will get Labs for evaluation of overall aches and peri-menopausal symptoms and dyslipidemia -pt to Stop sudafed for its likely contribution to her bp.   She can use mucinex, nasal steroid spray if desired to help eustachian tube dysfunction  2.  "Episode" of sob and ha and cp -will start Low dose metoprolol in light of the worrisome episode and her elevated bp.  She is counseled to use caution with standing to limit dizziness or falls -start Baby asa daily -this episode is more of a risk in light of Aortic atherosclerosis on CT done 01/31/22 -get CXR -pt has [facility] financial assistance in effect until 06/21/22 -Refer to cardiology for input and evaluation if needed in this usual presentation -discussed with pt that she will be started on statin at her next appointment (after labs updated)   3.  sweating -will get Labs for evaluation of overall aches and peri-menopausal symptoms and dyslipidemia   4.  OAB -pt prefers to not take oxybutynin.  She is doing fine with wearing a pad  -pt to follow up 2 weeks.  She is to contact office sooner prn worsening or if she has another episode or for new symptoms

## 2022-05-02 LAB — FSH/LH
FSH: 26.9 m[IU]/mL
LH: 12.5 m[IU]/mL

## 2022-05-02 LAB — RHEUMATOID FACTOR: Rheumatoid fact SerPl-aCnc: <10 IU/mL (ref <14.0)

## 2022-05-17 ENCOUNTER — Other Ambulatory Visit: Payer: Self-pay | Admitting: Physician Assistant

## 2022-05-17 ENCOUNTER — Encounter: Payer: Self-pay | Admitting: Physician Assistant

## 2022-05-17 ENCOUNTER — Ambulatory Visit: Payer: Medicaid Other | Admitting: Physician Assistant

## 2022-05-17 ENCOUNTER — Encounter: Payer: Self-pay | Admitting: Internal Medicine

## 2022-05-17 VITALS — BP 136/88 | HR 87 | Temp 97.8°F | Wt 238.5 lb

## 2022-05-17 DIAGNOSIS — E278 Other specified disorders of adrenal gland: Secondary | ICD-10-CM

## 2022-05-17 DIAGNOSIS — F419 Anxiety disorder, unspecified: Secondary | ICD-10-CM

## 2022-05-17 DIAGNOSIS — M65311 Trigger thumb, right thumb: Secondary | ICD-10-CM

## 2022-05-17 DIAGNOSIS — E785 Hyperlipidemia, unspecified: Secondary | ICD-10-CM

## 2022-05-17 MED ORDER — ATORVASTATIN CALCIUM 20 MG PO TABS: 20.0000 mg | ORAL_TABLET | Freq: Every day | ORAL | 1 refills | Status: AC

## 2022-05-17 MED ORDER — ATORVASTATIN CALCIUM 20 MG PO TABS: 20.0000 mg | ORAL_TABLET | Freq: Every day | ORAL | 1 refills | Status: DC

## 2022-05-17 NOTE — Patient Instructions (Signed)

## 2022-05-17 NOTE — Progress Notes (Signed)
BP 136/88   Pulse 87   Temp 97.8 F (36.6 C)   Wt 238 lb 8 oz (108.2 kg)   SpO2 97%   BMI 45.06 kg/m    Subjective:    Patient ID: [name], female    DOB: 06/04/1976, 46 y.o.   MRN: [id number]  HPI: Kitana Rosalyn Archambault is a 46 y.o. female presenting on 05/17/2022 for Follow-up (Pt states she has not started her HTN medication. Pt states she bought a bp machine to check at home and it has been running 112/60 - 150/100. Pt is worried that if she takes bp medication her bp may drop too low. Pt states her main bp reading was about 140-148/90. Pt was on the normal range for about 5 days.)   HPI   Chief Complaint  Patient presents with   Follow-up    Pt states she has not started her HTN medication. Pt states she bought a bp machine to check at home and it has been running 112/60 - 150/100. Pt is worried that if she takes bp medication her bp may drop too low. Pt states her main bp reading was about 140-148/90. Pt was on the normal range for about 5 days.     Pt hasn't taken the metoprolol at all because she is so scared that her bp will go too low.  She is monitoring her bp three times daily.    She has not had another really bad episode of the CP but she feels heavy in the chest that comes and goes.  It comes maybe 2 or 3 times/week.   It lasts for maybe 2 minutes.  It comes when she is exerting herself or is very stressed  (like having an issue with her husband).   She says her breathing has been okay  She admits she is Very anxious  She Stopped sudafed as discussed at last appointment.     She says she has had No more episodes of awakening with gasping for breath  She works as caregiver for her parents, both of whom are ambulatory and both drive so they don't need a whole lot.    She doesn't exercise.     Relevant past medical, surgical, family and social history reviewed and updated as indicated. Interim medical history since our last visit reviewed. Allergies  and medications reviewed and updated.   Current Outpatient Medications:    Acetaminophen (TYLENOL ARTHRITIS PAIN PO), Take 650 mg by mouth as needed., Disp: , Rfl:    aspirin EC 81 MG tablet, Take 1 tablet (81 mg total) by mouth daily. Swallow whole., Disp: 36 tablet, Rfl: 0   cyclobenzaprine (FLEXERIL) 10 MG tablet, Take 10 mg by mouth 3 (three) times daily as needed for muscle spasms., Disp: , Rfl:    linaclotide (LINZESS) 145 MCG CAPS capsule, Take by mouth as needed., Disp: , Rfl:    Omega-3 Krill Oil 1000 MG CAPS, Take 1 tablet by mouth daily., Disp: , Rfl:    metoprolol tartrate (LOPRESSOR) 25 MG tablet, Take 1 tablet (25 mg total) by mouth 2 (two) times daily. (Patient not taking: Reported on 05/17/2022), Disp: 60 tablet, Rfl: 3    Review of Systems  Per HPI unless specifically indicated above     Objective:    BP 136/88   Pulse 87   Temp 97.8 F (36.6 C)   Wt 238 lb 8 oz (108.2 kg)   SpO2 97%   BMI 45.06 kg/m  Wt Readings from Last 3 Encounters:  05/17/22 238 lb 8 oz (108.2 kg)  03/28/22 233 lb (105.7 kg)  03/16/22 233 lb (105.7 kg)    Physical Exam Vitals reviewed.  Constitutional:      General: She is not in acute distress.    Appearance: She is not ill-appearing or toxic-appearing.  HENT:     Head: Normocephalic and atraumatic.  Cardiovascular:     Rate and Rhythm: Normal rate and regular rhythm.  Pulmonary:     Effort: Pulmonary effort is normal.     Breath sounds: Normal breath sounds.  Abdominal:     General: Bowel sounds are normal.     Palpations: Abdomen is soft. There is no mass.     Tenderness: There is no abdominal tenderness.  Musculoskeletal:     Cervical back: Neck supple.     Right lower leg: No edema.     Left lower leg: No edema.     Comments: Right thumb IP clicks and catches with flexion/extension.  Lymphadenopathy:     Cervical: No cervical adenopathy.  Skin:    General: Skin is warm and dry.  Neurological:     Mental Status: She  is alert and oriented to person, place, and time.  Psychiatric:        Attention and Perception: Attention normal.        Mood and Affect: Mood is anxious.        Speech: Speech normal.        Behavior: Behavior normal. Behavior is cooperative.     Comments: Pt is engaged and pleasant albeit anxious        Results for orders placed or performed during the hospital encounter of 05/01/22  Rheumatoid Factor  Result Value Ref Range   Rhuematoid fact SerPl-aCnc <10.0 <14.0 IU/mL  FSH/LH  Result Value Ref Range   LH 12.5 mIU/mL   FSH 26.9 mIU/mL  Sed Rate (ESR)  Result Value Ref Range   Sed Rate 5 0 - 22 mm/hr  TSH  Result Value Ref Range   TSH 1.250 0.350 - 4.500 uIU/mL  CBC  Result Value Ref Range   WBC 5.2 4.0 - 10.5 K/uL   RBC 4.69 3.87 - 5.11 MIL/uL   Hemoglobin 13.7 12.0 - 15.0 g/dL   HCT 41.5 36.0 - 46.0 %   MCV 88.5 80.0 - 100.0 fL   MCH 29.2 26.0 - 34.0 pg   MCHC 33.0 30.0 - 36.0 g/dL   RDW 13.1 11.5 - 15.5 %   Platelets 188 150 - 400 K/uL   nRBC 0.0 0.0 - 0.2 %  Comprehensive metabolic panel  Result Value Ref Range   Sodium 138 135 - 145 mmol/L   Potassium 4.1 3.5 - 5.1 mmol/L   Chloride 107 98 - 111 mmol/L   CO2 24 22 - 32 mmol/L   Glucose, Bld 98 70 - 99 mg/dL   BUN 13 6 - 20 mg/dL   Creatinine, Ser 0.59 0.44 - 1.00 mg/dL   Calcium 8.6 (L) 8.9 - 10.3 mg/dL   Total Protein 6.9 6.5 - 8.1 g/dL   Albumin 4.1 3.5 - 5.0 g/dL   AST 22 15 - 41 U/L   ALT 42 0 - 44 U/L   Alkaline Phosphatase 71 38 - 126 U/L   Total Bilirubin 0.4 0.3 - 1.2 mg/dL   GFR, Estimated >60 >60 mL/min   Anion gap 7 5 - 15  Lipid panel  Result Value Ref Range  Cholesterol 219 (H) 0 - 200 mg/dL   Triglycerides 180 (H) <150 mg/dL   HDL 39 (L) >40 mg/dL   Total CHOL/HDL Ratio 5.6 RATIO   VLDL 36 0 - 40 mg/dL   LDL Cholesterol 144 (H) 0 - 99 mg/dL      Assessment & Plan:   Encounter Diagnoses  Name Primary?   Hyperlipidemia, unspecified hyperlipidemia type Yes   Class 3 severe  obesity with body mass index (BMI) of 45.0 to 49.9 in adult, unspecified obesity type, unspecified whether serious comorbidity present (HCC)    Trigger finger of right thumb    Anxiety    Adrenal nodule ([place])      -reviewed labs with pt -Start  Atorvastatin.  Pt given reading information on cholesterol and lowfat diet -discussed metoprolol and she will start taking it -will refer to orthopedics for the trigger thumb -pt is scheduled with [facility] for anxiety and strategies to help with her worrying -Adrenal nodule on CT with recommendation for  f/u CT 01/2023 -Cardiology appointment as  scheduled -pt reminded [facility] good through 06/21/22 and she will need to renew it after that date -pt to follow up here after her cardiology appointment.   She is to contact office sooner prn

## 2022-05-23 DIAGNOSIS — M25519 Pain in unspecified shoulder: Secondary | ICD-10-CM | POA: Insufficient documentation

## 2022-05-25 ENCOUNTER — Ambulatory Visit (INDEPENDENT_AMBULATORY_CARE_PROVIDER_SITE_OTHER): Payer: Self-pay | Admitting: Orthopedic Surgery

## 2022-05-25 ENCOUNTER — Encounter: Payer: Self-pay | Admitting: Orthopedic Surgery

## 2022-05-25 VITALS — BP 140/88 | HR 88 | Ht 61.0 in | Wt 240.0 lb

## 2022-05-25 DIAGNOSIS — M65311 Trigger thumb, right thumb: Secondary | ICD-10-CM

## 2022-05-25 MED ORDER — INDOMETHACIN 25 MG PO CAPS: 25.0000 mg | ORAL_CAPSULE | Freq: Two times a day (BID) | ORAL | 0 refills | Status: AC

## 2022-05-25 NOTE — Progress Notes (Signed)
Chief Complaint  Patient presents with   Hand Problem    Left thumb catching/ locking    New problem , px established, last seen 7/20 for cubital tunnel release   Ms. [name] is 46 years old she has pain over the A1 pulley of her right thumb and has associated clicking and popping and occasionally has to manipulate the thumb  She did try bracing and has severe pain at night  Examination of the right hand skin is intact she is tender over the A1 pulley she has normal flexion extension strength and power normal sensation and capillary refill   The signs and symptoms are consistent with catching and trigger thumb  We offered her injection she preferred medication  She is not diabetic but she had a cortisone shot and had some type of reaction to it so we will avoid steroids at this time and put her on Indocin  Encounter Diagnosis  Name Primary?   Trigger thumb of right hand Yes    Fu in 1 month

## 2022-05-30 ENCOUNTER — Ambulatory Visit (INDEPENDENT_AMBULATORY_CARE_PROVIDER_SITE_OTHER): Payer: Medicaid Other | Admitting: Gastroenterology

## 2022-05-30 ENCOUNTER — Ambulatory Visit: Payer: Medicaid Other

## 2022-05-30 DIAGNOSIS — F419 Anxiety disorder, unspecified: Secondary | ICD-10-CM

## 2022-06-19 ENCOUNTER — Encounter: Payer: Self-pay | Admitting: *Deleted

## 2022-06-19 ENCOUNTER — Ambulatory Visit: Payer: Self-pay | Attending: Internal Medicine | Admitting: Internal Medicine

## 2022-06-19 ENCOUNTER — Telehealth: Payer: Self-pay | Admitting: Internal Medicine

## 2022-06-19 ENCOUNTER — Encounter: Payer: Self-pay | Admitting: Internal Medicine

## 2022-06-19 VITALS — BP 142/100 | HR 71 | Ht 61.0 in | Wt 245.4 lb

## 2022-06-19 DIAGNOSIS — R29818 Other symptoms and signs involving the nervous system: Secondary | ICD-10-CM

## 2022-06-19 DIAGNOSIS — G4733 Obstructive sleep apnea (adult) (pediatric): Secondary | ICD-10-CM | POA: Insufficient documentation

## 2022-06-19 DIAGNOSIS — R079 Chest pain, unspecified: Secondary | ICD-10-CM | POA: Insufficient documentation

## 2022-06-19 DIAGNOSIS — E785 Hyperlipidemia, unspecified: Secondary | ICD-10-CM | POA: Insufficient documentation

## 2022-06-19 DIAGNOSIS — I10 Essential (primary) hypertension: Secondary | ICD-10-CM | POA: Insufficient documentation

## 2022-06-19 MED ORDER — CHLORTHALIDONE 25 MG PO TABS: 25.0000 mg | ORAL_TABLET | Freq: Every day | ORAL | 3 refills | Status: DC

## 2022-06-19 MED ORDER — NITROGLYCERIN 0.4 MG SL SUBL: 0.4000 mg | SUBLINGUAL_TABLET | SUBLINGUAL | 3 refills | Status: AC | PRN

## 2022-06-19 NOTE — Patient Instructions (Addendum)
Medication Instructions:  Your physician has recommended you make the following change in your medication:  Stop aspirin Stop metoprolol Start chlorthalidone 25 mg daily Nitroglycerin 0.4 mg sublingual tablets sent. Dissolve one under tongue for severe chest pain every 5 minutes up to 3 doses. If no relief, proceed to ED.  Labwork: none  Testing/Procedures: Your physician has requested that you have an echocardiogram. Echocardiography is a painless test that uses sound waves to create images of your heart. It provides your doctor with information about the size and shape of your heart and how well your heart's chambers and valves are working. This procedure takes approximately one hour. There are no restrictions for this procedure. Please do NOT wear cologne, perfume, aftershave, or lotions (deodorant is allowed). Please arrive 15 minutes prior to your appointment time. Your physician has requested that you have en exercise stress myoview. For further information please visit [email or web address]. Please follow instruction sheet, as given. Your physician has recommended that you have a sleep study. This test records several body functions during sleep, including: brain activity, eye movement, oxygen and carbon dioxide blood levels, heart rate and rhythm, breathing rate and rhythm, the flow of air through your mouth and nose, snoring, body muscle movements, and chest and belly movement. WatchPat  Follow-Up: Your physician recommends that you schedule a follow-up appointment in: 3 months  Any Other Special Instructions Will Be Listed Below (If Applicable).  If you need a refill on your cardiac medications before your next appointment, please call your pharmacy.`

## 2022-06-19 NOTE — Progress Notes (Signed)
Cardiology Office Note  Date: 06/19/2022   ID: [name], DOB 1976/06/20, MRN [id number]  PCP:  Soyla Dryer, PA-C  Cardiologist:  Chalmers Guest, MD Electrophysiologist:  None   Reason for Office Visit: Evaluation of chest pain at the request of [name], PA-C   History of Present Illness: [name] is a 46 y.o. female known to have HTN was referred to the cardiology clinic for evaluation of chest pain at the request of [name], PA-C.  Patient stated that she has been having daily intermittent dull aching chest pain on the left side of her chest unrelated to rest or exertion. She also has substernal chest tightness that develops with stress and relieved with deep breathing techniques. She believes that this chest tightness episode could be from her having a panic attack. She also had another quality of chest pain waking her up from sleep couple of months ago after which it did not recur. All the above multiple qualities of chest pain has started more than a couple of months ago and her blood pressure happened to be elevated to 140-160 mm Hg SBP during those occasions.She reported SOB and palpitations whenever she climbs stairs and also LE swelling. Denied any syncope, dizziness.  No prior ischemic evaluation. HTN runs in her family. Former smoker, denied alcohol use or illicit drug use.  No prior MI/PCI/CABG.  Patient has trouble sleeping in the night, snores in the night, wakes up with headaches and not feeling refreshed.  She was never tested for sleep apnea in the past.  Patient was started on metoprolol by her PCP for HTN management. Currently she does not exercise due to bad knees.  Past Medical History:  Diagnosis Date   Back complaints    Bipolar 1 disorder ([place])    Depression     Past Surgical History:  Procedure Laterality Date   ABDOMINAL HYSTERECTOMY     CARPAL TUNNEL RELEASE Right 2007   CHOLECYSTECTOMY     COLONOSCOPY WITH PROPOFOL N/A  03/16/2022   Procedure: COLONOSCOPY WITH PROPOFOL;  Surgeon: Harvel Quale, MD;  Location: [facility];  Service: Gastroenterology;  Laterality: N/A;  1245 ASA 1   GALLBLADDER SURGERY     OVARIAN CYST REMOVAL     PARTIAL HYSTERECTOMY     POLYPECTOMY  03/16/2022   Procedure: POLYPECTOMY;  Surgeon: Harvel Quale, MD;  Location: [facility];  Service: Gastroenterology;;   TUBAL LIGATION      Current Outpatient Medications  Medication Sig Dispense Refill   Acetaminophen (TYLENOL ARTHRITIS PAIN PO) Take 650 mg by mouth as needed.     atorvastatin (LIPITOR) 20 MG tablet Take 1 tablet (20 mg total) by mouth daily. 30 tablet 1   cyclobenzaprine (FLEXERIL) 10 MG tablet Take 10 mg by mouth 3 (three) times daily as needed for muscle spasms.     indomethacin (INDOCIN) 25 MG capsule Take 1 capsule (25 mg total) by mouth 2 (two) times daily with a meal. 60 capsule 0   linaclotide (LINZESS) 145 MCG CAPS capsule Take by mouth as needed.     Omega-3 Krill Oil 1000 MG CAPS Take 1 tablet by mouth daily.     No current facility-administered medications for this visit.   Allergies:  Cortisone   Social History: The patient  reports that she quit smoking about 7 years ago. Her smoking use included cigarettes. She has been exposed to tobacco smoke. She has never used smokeless tobacco. She reports that she  does not currently use alcohol. She reports current drug use. Drug: Marijuana.   Family History: The patient's family history includes Cancer in her mother; Diabetes in her mother; Heart disease in her father; Hypertension in her father and mother; Kidney cancer in her mother; Prostate cancer in her father.   ROS:  Please see the history of present illness. Otherwise, complete review of systems is positive for none.  All other systems are reviewed and negative.   Physical Exam: VS:  BP (!) 142/100 (BP Location: Left Arm, Patient Position: Sitting, Cuff Size: Large)   Pulse 71   Ht  '5\' 1"'$  (1.549 m)   Wt 245 lb 6.4 oz (111.3 kg)   SpO2 96%   BMI 46.37 kg/m , BMI Body mass index is 46.37 kg/m.  Wt Readings from Last 3 Encounters:  06/19/22 245 lb 6.4 oz (111.3 kg)  05/25/22 240 lb (108.9 kg)  05/17/22 238 lb 8 oz (108.2 kg)    General: Patient appears comfortable at rest. HEENT: Conjunctiva and lids normal, oropharynx clear with moist mucosa. Neck: Supple, no elevated JVP or carotid bruits, no thyromegaly. Lungs: Clear to auscultation, nonlabored breathing at rest. Cardiac: Regular rate and rhythm, no S3 or significant systolic murmur, no pericardial rub. Abdomen: Soft, nontender, no hepatomegaly, bowel sounds present, no guarding or rebound. Extremities: Trace pitting edema, distal pulses 2+. Skin: Warm and dry. Musculoskeletal: No kyphosis. Neuropsychiatric: Alert and oriented x3, affect grossly appropriate.  ECG:  An ECG dated 06/19/2022 was personally reviewed today and demonstrated:  Normal sinus rhythm and no ST-T changes  Recent Labwork: 05/01/2022: ALT 42; AST 22; BUN 13; Creatinine, Ser 0.59; Hemoglobin 13.7; Platelets 188; Potassium 4.1; Sodium 138; TSH 1.250     Component Value Date/Time   CHOL 219 (H) 05/01/2022 1026   TRIG 180 (H) 05/01/2022 1026   HDL 39 (L) 05/01/2022 1026   CHOLHDL 5.6 05/01/2022 1026   VLDL 36 05/01/2022 1026   LDLCALC 144 (H) 05/01/2022 1026    Other Studies Reviewed Today: None  Assessment and Plan: Patient is a 46 year old F known to have HTN was referred to the cardiology clinic for evaluation of chest pain at the request of [name], PA-C.  #Possibly cardiac chest pain Plan -Obtain 2D echo and exercise Myoview (if patient is not able to walk on treadmill, switch to Lexiscan) -SL NTG 0.4 mg as needed  #HTN, poorly controlled Plan -Stop metoprolol tartrate 25 mg twice a day -Start chlorthalidone 25 mg once daily -Counseled on weight loss, 2g sodium diet -Educated on the side effect of elevated blood pressure  from indomethacin  #Screening for OSA Plan -Patient has trouble sleeping in the night, snores in the night, wakes up with headaches and not feeling refreshed.  She was never tested for sleep apnea in the past. Patient will benefit from OSA evaluation home sleep study.  #HLD, not at goal Plan -Continue atorvastatin 20 mg nightly. Goal less than 100. -Counseled on weight loss  #Primary prevention of CAD Plan -Aspirin 81 mg is not indicated, rec to DC  I have spent a total of 45 minutes with patient reviewing chart, EKGs, labs and examining patient as well as establishing an assessment and plan that was discussed with the patient.  > 50% of time was spent in direct patient care.      Medication Adjustments/Labs and Tests Ordered: Current medicines are reviewed at length with the patient today.  Concerns regarding medicines are outlined above.   Tests Ordered: No orders  of the defined types were placed in this encounter.   Medication Changes: No orders of the defined types were placed in this encounter.   Disposition:  Follow up  3 months  Signed Yuki Purves Fidel Levy, MD, 06/19/2022 9:33 AM    [facility] [place] [facility], [place], [place] [address]

## 2022-06-19 NOTE — Progress Notes (Signed)
Sleep Apnea Evaluation  [facility]  Today's Date: 06/19/2022   Patient Name: [name]        DOB: March 12, 1976       Height:        Weight:    BMI: There is no height or weight on file to calculate BMI.    Referring Provider:  Vishnu Mallipedi   STOP-BANG RISK ASSESSMENT         If STOP-BANG Score ?3 OR two clinical symptoms - patient qualifies for WatchPAT (CPT 95800)      Sleep study ordered due to two (2) of the following clinical symptoms/diagnoses:  Excessive daytime sleepiness G47.10  Gastroesophageal reflux K21.9  Nocturia R35.1  Morning Headaches G44.221  Difficulty concentrating R41.840  Memory problems or poor judgment G31.84  Personality changes or irritability R45.4  Loud snoring R06.83  Depression F32.9  Unrefreshed by sleep G47.8  Impotence N52.9  History of high blood pressure R03.0  Insomnia G47.00  Sleep Disordered Breathing or Sleep Apnea ICD G47.33

## 2022-06-19 NOTE — Telephone Encounter (Signed)
Checking percert on the following patient for testing scheduled at [facility].       Exercise Myoview (may switch to Lexi if unable to walk) dx: chest pain  WatchPat dx: suspected sleep apnea    EXERCISE SET FOR 07-03-2022

## 2022-06-20 ENCOUNTER — Ambulatory Visit: Payer: Medicaid Other

## 2022-06-26 ENCOUNTER — Ambulatory Visit (INDEPENDENT_AMBULATORY_CARE_PROVIDER_SITE_OTHER): Payer: Self-pay | Admitting: Orthopedic Surgery

## 2022-06-26 ENCOUNTER — Encounter: Payer: Self-pay | Admitting: Physician Assistant

## 2022-06-26 ENCOUNTER — Encounter: Payer: Self-pay | Admitting: Orthopedic Surgery

## 2022-06-26 ENCOUNTER — Ambulatory Visit: Payer: Medicaid Other | Admitting: Physician Assistant

## 2022-06-26 VITALS — BP 126/80 | HR 79 | Temp 97.9°F | Wt 240.5 lb

## 2022-06-26 DIAGNOSIS — M65311 Trigger thumb, right thumb: Secondary | ICD-10-CM

## 2022-06-26 DIAGNOSIS — E785 Hyperlipidemia, unspecified: Secondary | ICD-10-CM

## 2022-06-26 DIAGNOSIS — M2550 Pain in unspecified joint: Secondary | ICD-10-CM

## 2022-06-26 DIAGNOSIS — M7710 Lateral epicondylitis, unspecified elbow: Secondary | ICD-10-CM

## 2022-06-26 DIAGNOSIS — F419 Anxiety disorder, unspecified: Secondary | ICD-10-CM

## 2022-06-26 DIAGNOSIS — I10 Essential (primary) hypertension: Secondary | ICD-10-CM

## 2022-06-26 MED ORDER — METHYLPREDNISOLONE ACETATE 40 MG/ML IJ SUSP
2.0000 mg | INTRAMUSCULAR | Status: AC | PRN
  Administered 2022-06-26: 2 mg

## 2022-06-26 MED ORDER — LIDOCAINE HCL 1 % IJ SOLN
2.0000 mL | INTRAMUSCULAR | Status: AC | PRN
  Administered 2022-06-26: 2 mL

## 2022-06-26 NOTE — Progress Notes (Signed)
   Procedure Note  Patient: [name]             Date of Birth: Aug 10, 1976           MRN: [id number]             Visit Date: 06/26/2022  Procedures: Visit Diagnoses:  1. Trigger thumb of right hand   2. Lateral epicondylitis, unspecified laterality     Hand/UE Inj: R thumb A1 for trigger finger on 06/26/2022 10:02 AM Details: 25 G needle, volar approach Medications: 2 mL lidocaine 1 %; 2 mg methylPREDNISolone acetate 40 MG/ML Procedure, treatment alternatives, risks and benefits explained, specific risks discussed. Consent was given by the patient. Immediately prior to procedure a time out was called to verify the correct patient, procedure, equipment, support staff and site/side marked as required. Patient was prepped and draped in the usual sterile fashion.       FOLLOW UP   Encounter Diagnosis  Name Primary?   Trigger thumb of right hand Yes    Chief Complaint  Patient presents with   Right Hand - Pain   Treated for trigger  46 year old female thumb with splinting at her request versus injection she also took Indocin.  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  She got a reaction from the injection with arm swelling and had to have a steroid injection for the reaction  We added some exercises and explained to her that this takes a long time she should use cryotherapy and topical medications

## 2022-06-26 NOTE — Patient Instructions (Signed)

## 2022-06-26 NOTE — Progress Notes (Signed)
BP 126/80   Pulse 79   Temp 97.9 F (36.6 C)   Wt 240 lb 8 oz (109.1 kg)   SpO2 97%   BMI 45.44 kg/m    Subjective:    Patient ID: [name], female    DOB: 06-12-1976, 46 y.o.   MRN: [id number]  HPI: [name] is a 46 y.o. female presenting on 06/26/2022 for No chief complaint on file.   HPI  Pt is 7yoF with multiple issues who is in today for recheck.    She has follow-up with orthopedics at 10am today for her trigger thumb.     She was seen by cardiology for her CP and she has appointments for echo and stress test.   She hurts all over.  She says the cold makes it worse.   The only thing she is taking for pain at present is the indocin 23m bid that was given to her by orthopedics.   At her appointment on 05/01/22 she reported that her pain was worse with rain.   She had labs done at that time including ESR and RF, both were negative.    [facility] she missed her appt on 10/31 due to illness.     Pt is checking her bp at home but says she only checks it once/day now and it has been running good.           Relevant past medical, surgical, family and social history reviewed and updated as indicated. Interim medical history since our last visit reviewed. Allergies and medications reviewed and updated.    Current Outpatient Medications:    atorvastatin (LIPITOR) 20 MG tablet, Take 1 tablet (20 mg total) by mouth daily., Disp: 30 tablet, Rfl: 1   chlorthalidone (HYGROTON) 25 MG tablet, Take 1 tablet (25 mg total) by mouth daily., Disp: 30 tablet, Rfl: 3   indomethacin (INDOCIN) 25 MG capsule, Take 1 capsule (25 mg total) by mouth 2 (two) times daily with a meal., Disp: 60 capsule, Rfl: 0   linaclotide (LINZESS) 145 MCG CAPS capsule, Take by mouth as needed., Disp: , Rfl:    Acetaminophen (TYLENOL ARTHRITIS PAIN PO), Take 650 mg by mouth as needed. (Patient not taking: Reported on 06/26/2022), Disp: , Rfl:    cyclobenzaprine (FLEXERIL) 10 MG  tablet, Take 10 mg by mouth 3 (three) times daily as needed for muscle spasms. (Patient not taking: Reported on 06/26/2022), Disp: , Rfl:    nitroGLYCERIN (NITROSTAT) 0.4 MG SL tablet, Place 1 tablet (0.4 mg total) under the tongue every 5 (five) minutes x 3 doses as needed for chest pain (if no relief after 3rd dose, proceed to ED or call 911). (Patient not taking: Reported on 06/26/2022), Disp: 25 tablet, Rfl: 3   Omega-3 Krill Oil 1000 MG CAPS, Take 1 tablet by mouth daily. (Patient not taking: Reported on 06/26/2022), Disp: , Rfl:     Review of Systems  Per HPI unless specifically indicated above     Objective:    BP 126/80   Pulse 79   Temp 97.9 F (36.6 C)   Wt 240 lb 8 oz (109.1 kg)   SpO2 97%   BMI 45.44 kg/m   Wt Readings from Last 3 Encounters:  06/26/22 240 lb 8 oz (109.1 kg)  06/19/22 245 lb 6.4 oz (111.3 kg)  05/25/22 240 lb (108.9 kg)    Physical Exam Vitals reviewed.  Constitutional:      General: She  is not in acute distress.    Appearance: She is well-developed. She is obese. She is not toxic-appearing.  HENT:     Head: Normocephalic and atraumatic.  Cardiovascular:     Rate and Rhythm: Normal rate and regular rhythm.  Pulmonary:     Effort: Pulmonary effort is normal.     Breath sounds: Normal breath sounds.  Abdominal:     General: Bowel sounds are normal.     Palpations: Abdomen is soft. There is no mass.     Tenderness: There is no abdominal tenderness.  Musculoskeletal:        General: No swelling.     Cervical back: Neck supple.     Right lower leg: No edema.     Left lower leg: No edema.     Comments: No swelling of joints noted  Lymphadenopathy:     Cervical: No cervical adenopathy.  Skin:    General: Skin is warm and dry.  Neurological:     Mental Status: She is alert and oriented to person, place, and time.  Psychiatric:        Behavior: Behavior normal.            Assessment & Plan:    Encounter Diagnoses  Name Primary?    Primary hypertension Yes   Hyperlipidemia, unspecified hyperlipidemia type    Anxiety    Generalized joint pain    Class 3 severe obesity with body mass index (BMI) of 45.0 to 49.9 in adult, unspecified obesity type, unspecified whether serious comorbidity present [facility])     -pt is rescheduled to see [facility] -pt to Continue with orthopedics per his recomendation -pt needs Bmp last week of November due to new diuretic antihypertensive rx -? To pain dr for eval for fibromyalgia -pt encouraged to Update her [employer] financial assistance -F/u 1 mo to discuss pain.  Will update labs before appointment.  She is to contact office sooner prn

## 2022-06-28 ENCOUNTER — Ambulatory Visit: Payer: Medicaid Other | Attending: Internal Medicine

## 2022-06-28 DIAGNOSIS — R079 Chest pain, unspecified: Secondary | ICD-10-CM | POA: Insufficient documentation

## 2022-06-28 DIAGNOSIS — I10 Essential (primary) hypertension: Secondary | ICD-10-CM | POA: Insufficient documentation

## 2022-06-28 DIAGNOSIS — E785 Hyperlipidemia, unspecified: Secondary | ICD-10-CM | POA: Insufficient documentation

## 2022-06-28 DIAGNOSIS — Z87891 Personal history of nicotine dependence: Secondary | ICD-10-CM | POA: Insufficient documentation

## 2022-06-28 LAB — ECHOCARDIOGRAM COMPLETE
AR max vel: 1.87 cm2
AV Area VTI: 2.25 cm2
AV Area mean vel: 2.01 cm2
AV Mean grad: 4.4 mmHg
AV Peak grad: 8.2 mmHg
Ao pk vel: 1.43 m/s
Area-P 1/2: 4.21 cm2
Calc EF: 65.8 %
MV M vel: 1.86 m/s
MV Peak grad: 13.8 mmHg
S' Lateral: 3 cm
Single Plane A2C EF: 68.7 %
Single Plane A4C EF: 61.8 %

## 2022-07-03 ENCOUNTER — Ambulatory Visit (HOSPITAL_COMMUNITY)
Admission: RE | Admit: 2022-07-03 | Discharge: 2022-07-03 | Disposition: A | Discharge status: Home / Self Care | Payer: Medicaid Other | Source: Ambulatory Visit | Attending: Internal Medicine | Admitting: Internal Medicine

## 2022-07-03 ENCOUNTER — Encounter (HOSPITAL_COMMUNITY)
Admission: RE | Admit: 2022-07-03 | Discharge: 2022-07-03 | Disposition: A | Discharge status: Home / Self Care | Payer: Medicaid Other | Source: Ambulatory Visit | Attending: Internal Medicine | Admitting: Internal Medicine

## 2022-07-03 DIAGNOSIS — R079 Chest pain, unspecified: Secondary | ICD-10-CM | POA: Insufficient documentation

## 2022-07-03 LAB — NM MYOCAR MULTI W/SPECT W/WALL MOTION / EF
Angina Index: 0
Duke Treadmill Score: 7
Estimated workload: 7.5
Exercise duration (min): 6 min
Exercise duration (sec): 47 s
LV dias vol: 49 mL (ref 46–106)
LV sys vol: 16 mL
MPHR: 173 {beats}/min
Nuc Stress EF: 66 %
Peak HR: 155 {beats}/min
Percent HR: 89 %
RATE: 0.3
RPE: 15
Rest HR: 82 {beats}/min
Rest Nuclear Isotope Dose: 10 mCi
SDS: 8
SRS: 3
SSS: 11
ST Depression (mm): 0 mm
Stress Nuclear Isotope Dose: 30 mCi
TID: 0.79

## 2022-07-03 MED ORDER — SODIUM CHLORIDE FLUSH 0.9 % IV SOLN
INTRAVENOUS | Status: AC
  Administered 2022-07-03: 10 mL via INTRAVENOUS
  Filled 2022-07-03: qty 10

## 2022-07-03 MED ORDER — REGADENOSON 0.4 MG/5ML IV SOLN
INTRAVENOUS | Status: AC
  Filled 2022-07-03: qty 5

## 2022-07-03 MED ORDER — TECHNETIUM TC 99M TETROFOSMIN IV KIT
30.0000 | PACK | Freq: Once | INTRAVENOUS | Status: AC | PRN
  Administered 2022-07-03: 30 via INTRAVENOUS

## 2022-07-03 MED ORDER — TECHNETIUM TC 99M TETROFOSMIN IV KIT
10.0000 | PACK | Freq: Once | INTRAVENOUS | Status: AC | PRN
  Administered 2022-07-03: 10 via INTRAVENOUS

## 2022-07-06 NOTE — Addendum Note (Signed)
Addended by: Merlene Laughter on: 07/06/2022 01:20 PM   Modules accepted: Orders

## 2022-07-06 NOTE — Addendum Note (Signed)
Addended by: Merlene Laughter on: 07/06/2022 08:16 AM   Modules accepted: Orders

## 2022-07-07 ENCOUNTER — Telehealth: Payer: Self-pay | Admitting: *Deleted

## 2022-07-07 NOTE — Telephone Encounter (Signed)
Notified via detailed voice message, okay to activate itamar device - pin # [id number].

## 2022-07-07 NOTE — Telephone Encounter (Signed)
Notified Lovina Reach via staff message ok to activate itamar device.

## 2022-07-18 ENCOUNTER — Ambulatory Visit: Payer: Medicaid Other

## 2022-07-24 ENCOUNTER — Ambulatory Visit: Payer: Medicaid Other | Admitting: Physician Assistant

## 2022-08-02 ENCOUNTER — Telehealth: Payer: Self-pay

## 2022-08-02 NOTE — Telephone Encounter (Signed)
Called to follow up with client who had notified our office on 07/26/22 that she had Medicaid. She states she has her card, and was assigned [facility] and has tried to call but has been unable to get through.  I discussed our transition to Medicaid Tips sheet along with a list of local providers accepting new Medicaid patients. She states she will try [facility] again and then also let her know there are other providers in the [place] area taking new Medicaid patients and how she would call to change her provider assigned as well as inquire about the other available plans to see which fits her needs best. She will also look at her packet which should list plans available also and services provided.  Per her request will text copy of tips flyer and also list of PCP offices.  Will plan to follow up next week to assure she has established care.  Also recommended calling [facility] to transfer any available medications to a new pharmacy if needed. She currently has her medications.  [name]

## 2022-08-03 ENCOUNTER — Ambulatory Visit (INDEPENDENT_AMBULATORY_CARE_PROVIDER_SITE_OTHER): Payer: Medicaid Other | Admitting: Gastroenterology

## 2022-08-03 ENCOUNTER — Encounter (INDEPENDENT_AMBULATORY_CARE_PROVIDER_SITE_OTHER): Payer: Self-pay | Admitting: Gastroenterology

## 2022-08-03 VITALS — BP 132/85 | HR 84 | Temp 97.1°F | Ht 61.0 in | Wt 236.7 lb

## 2022-08-03 DIAGNOSIS — K649 Unspecified hemorrhoids: Secondary | ICD-10-CM

## 2022-08-03 DIAGNOSIS — K5900 Constipation, unspecified: Secondary | ICD-10-CM | POA: Diagnosis not present

## 2022-08-03 DIAGNOSIS — K625 Hemorrhage of anus and rectum: Secondary | ICD-10-CM

## 2022-08-03 MED ORDER — TRULANCE 3 MG PO TABS: 3.0000 mg | ORAL_TABLET | Freq: Every day | ORAL | 3 refills | Status: DC

## 2022-08-03 NOTE — Addendum Note (Signed)
Addended by: Harvel Quale on: 08/03/2022 12:03 PM   Modules accepted: Level of Service

## 2022-08-03 NOTE — Patient Instructions (Signed)
Increase water intake, aim for atleast 64 oz per day Increase fruits, veggies and whole grains, kiwi and prunes are especially good for constipation I am providing samples of trulance, will send an Rx for this to take daily. Please allow the mediacation a couple of weeks to really see results. If symptoms are not improving please let me know Continue to use tucks pads, avoid soaps, wipes and any other harsh products in rectal area  Follow up 6 months

## 2022-08-03 NOTE — Progress Notes (Addendum)
Referring Provider: Soyla Dryer, PA-C Primary Care Physician:  No primary care provider on file. Primary GI Physician: castaneda   Chief Complaint  Patient presents with   Follow-up    Patient here today for a follow up visit. She has seen some bright red blood in stools. She has not noticed any dark stools. Denies any shortness of breath or fatigue or dizziness.   HPI:   [name] is a 46 y.o. female with past medical history of bipolar disorder 1, depression   Patient presenting today for follow up of constipation.  Last seen July 2023, at that time reported rectal bleeding intermittently x10 year. Endorsed some constipation as well.    issues with heartburn on occasion, she sometimes drinks milk or will take pepto bismol tablets, sometimes will make herself vomit which helps. Previously on protonix or prevacid. She tries to avoid eating late and watching what she eats. Feels GERD is well managed currently, does not wish to take any Rx for this.  Recommend scheduling colonoscopy, linzess 126mg, limit straining and toilet time.  Present:  States she is doing better on linzess, she is taking linzess 1434m once a week as she is using samples, previously didn't have insurance so she could not get the prescription. Taking linzess only once per week with some results when she does take it. She is not using anything else for constipation She is straining a lot to go as she does not feel normal if she does not go every day. Still having some rectal bleeding, most of the times when she defecates or worse if she has not gone for a day or so. Denies melena. No abdominal pain. No weight loss or changes in appetite. She has some rectal itching and burning. Uses tucks pads which helps a little bit. Denies rectal pain.   Last Colonoscopy: 02/2022 examined ileum normal - One 4 mm polyp in the sigmoid colon, removed with a cold snare. Tubular adenoma - Diverticulosis in the sigmoid  colon. - Non-bleeding internal hemorrhoids. Last Endoscopy:  Recommendations:  Repeat in 7 years   Past Medical History:  Diagnosis Date   Back complaints    Bipolar 1 disorder ([place]   Depression     Past Surgical History:  Procedure Laterality Date   ABDOMINAL HYSTERECTOMY     CARPAL TUNNEL RELEASE Right 2007   CHOLECYSTECTOMY     COLONOSCOPY WITH PROPOFOL N/A 03/16/2022   Procedure: COLONOSCOPY WITH PROPOFOL;  Surgeon: CaHarvel QualeMD;  [facility];  Service: Gastroenterology;  Laterality: N/A;  1245 ASA 1   GALLBLADDER SURGERY     OVARIAN CYST REMOVAL     PARTIAL HYSTERECTOMY     POLYPECTOMY  03/16/2022   Procedure: POLYPECTOMY;  Surgeon: CaHarvel QualeMD;  Location: [facility];  Service: Gastroenterology;;   TUBAL LIGATION      Current Outpatient Medications  Medication Sig Dispense Refill   Acetaminophen (TYLENOL ARTHRITIS PAIN PO) Take 650 mg by mouth as needed.     atorvastatin (LIPITOR) 20 MG tablet Take 1 tablet (20 mg total) by mouth daily. 30 tablet 1   chlorthalidone (HYGROTON) 25 MG tablet Take 1 tablet (25 mg total) by mouth daily. 30 tablet 3   cyclobenzaprine (FLEXERIL) 10 MG tablet Take 10 mg by mouth 3 (three) times daily as needed for muscle spasms.     indomethacin (INDOCIN) 25 MG capsule Take 1 capsule (25 mg total) by mouth 2 (two) times daily  with a meal. 60 capsule 0   linaclotide (LINZESS) 145 MCG CAPS capsule Take by mouth as needed.     nitroGLYCERIN (NITROSTAT) 0.4 MG SL tablet Place 1 tablet (0.4 mg total) under the tongue every 5 (five) minutes x 3 doses as needed for chest pain (if no relief after 3rd dose, proceed to ED or call 911). 25 tablet 3   No current facility-administered medications for this visit.    Allergies as of 08/03/2022 - Review Complete 08/03/2022  Allergen Reaction Noted   Cortisone Swelling and Rash 05/24/2021    Family History  Problem Relation Age of Onset   Cancer Mother     Kidney cancer Mother    Hypertension Mother    Diabetes Mother    Heart disease Father    Hypertension Father    Prostate cancer Father     Social History   Socioeconomic History   Marital status: Married    Spouse name: Not on file   Number of children: Not on file   Years of education: Not on file   Highest education level: Not on file  Occupational History   Not on file  Tobacco Use   Smoking status: Former    Types: Cigarettes    Quit date: 05/22/2015    Years since quitting: 7.2    Passive exposure: Past   Smokeless tobacco: Never  Vaping Use   Vaping Use: Former  Substance and Sexual Activity   Alcohol use: Not Currently    Comment: history social etoh   Drug use: Yes    Types: 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   Sexual activity: Not on file  Other Topics Concern   Not on file  Social History Narrative   Not on file   Social Determinants of Health   Financial Resource Strain: Not on file  Food Insecurity: Not on file  Transportation Needs: Not on file  Physical Activity: Not on file  Stress: Not on file  Social Connections: Not on file   Review of systems General: negative for malaise, night sweats, fever, chills, weight loss Neck: Negative for lumps, goiter, pain and significant neck swelling Resp: Negative for cough, wheezing, dyspnea at rest CV: Negative for chest pain, leg swelling, palpitations, orthopnea GI: denies melena, nausea, vomiting, diarrhea, dysphagia, odyonophagia, early satiety or unintentional weight loss. +constipation, +rectal bleeding  MSK: Negative for joint pain or swelling, back pain, and muscle pain. Derm: Negative for itching or rash Psych: Denies depression, anxiety, memory loss, confusion. No homicidal or suicidal ideation.  Heme: Negative for prolonged bleeding, bruising easily, and swollen nodes. Endocrine: Negative for cold or heat intolerance, polyuria, polydipsia and goiter. Neuro: negative for  tremor, gait imbalance, syncope and seizures. The remainder of the review of systems is noncontributory.  Physical Exam: BP 132/85 (BP Location: Left Arm, Patient Position: Sitting, Cuff Size: Large)   Pulse 84   Temp (!) 97.1 F (36.2 C) (Temporal)   Ht '5\' 1"'$  (1.549 m)   Wt 236 lb 11.2 oz (107.4 kg)   BMI 44.72 kg/m  General:   Alert and oriented. No distress noted. Pleasant and cooperative.  Head:  Normocephalic and atraumatic. Eyes:  Conjuctiva clear without scleral icterus. Mouth:  Oral mucosa pink and moist. Good dentition. No lesions. Heart: Normal rate and rhythm, s1 and s2 heart sounds present.  Lungs: Clear lung sounds in all lobes. Respirations equal and unlabored. Abdomen:  +BS, soft, non-tender and non-distended. No  rebound or guarding. No HSM or masses noted. Derm: No palmar erythema or jaundice Msk:  Symmetrical without gross deformities. Normal posture. Extremities:  Without edema. Neurologic:  Alert and  oriented x4 Psych:  Alert and cooperative. Normal mood and affect.  Invalid input(s): "6 MONTHS"   ASSESSMENT: Addalynne Kensie Susman is a 46 y.o. female presenting today for follow up of constipation and rectal bleeding.  Patient continues to have constipation and some rectal bleeding.  Colonoscopy in July with 1 tubular adenoma and internal hemorrhoids.  Rectal bleeding thought secondary to hemorrhoids in setting of constipation.  She did well on the Linzess 145 mcg samples that she was given at last visit however this is not covered by her insurance.  She was taking the Linzess maybe once per week as she was rationing her samples.  It appears Trulance is covered on her insurance so we will switch to this and try 3 mg daily, I will provide samples in the meantime as a PA will be required. Increase water intake, aim for atleast 64 oz per day. Increase fruits, veggies and whole grains, kiwi and prunes are especially good for constipation.  She notes some rectal itching at  times, she is using Tucks pads.  She can continue to use the Tucks pads, I encouraged her to avoid using lites or any harsh soaps or powders in this area.    PLAN:  Continue good water intake  2. Trulance '3mg'$  daily, samples and Rx-will require PA  3. Increase water intake, aim for atleast 64 oz per day Increase fruits, veggies and whole grains, kiwi and prunes are especially good for constipation 4. Avoid straining, limit toilet time 5. Avoid harsh soaps, wipes in rectal area, can continue tucks pads  All questions were answered, patient verbalized understanding and is in agreement with plan as outlined above.   Follow Up: 6 months   Zubayr Bednarczyk L. Alver Sorrow, MSN, APRN, AGNP-C Adult-Gerontology Nurse Practitioner [facility]  I have reviewed the note and agree with the APP's assessment as described in this progress note  Maylon Peppers, MD Gastroenterology and Hepatology [facility]

## 2022-08-07 ENCOUNTER — Telehealth (INDEPENDENT_AMBULATORY_CARE_PROVIDER_SITE_OTHER): Payer: Self-pay | Admitting: *Deleted

## 2022-08-07 ENCOUNTER — Encounter (HOSPITAL_BASED_OUTPATIENT_CLINIC_OR_DEPARTMENT_OTHER): Payer: Medicaid Other | Admitting: Cardiology

## 2022-08-07 DIAGNOSIS — R0683 Snoring: Secondary | ICD-10-CM

## 2022-08-07 DIAGNOSIS — G4719 Other hypersomnia: Secondary | ICD-10-CM

## 2022-08-07 NOTE — Telephone Encounter (Signed)
Trulance denied by insurance because she has only tried and failed on preferred linzess and the other one she needs to try and fail is [place]. Please advise.

## 2022-08-08 ENCOUNTER — Telehealth (INDEPENDENT_AMBULATORY_CARE_PROVIDER_SITE_OTHER): Payer: Self-pay

## 2022-08-08 ENCOUNTER — Ambulatory Visit: Payer: Medicaid Other | Attending: Internal Medicine

## 2022-08-08 ENCOUNTER — Other Ambulatory Visit (INDEPENDENT_AMBULATORY_CARE_PROVIDER_SITE_OTHER): Payer: Self-pay | Admitting: Gastroenterology

## 2022-08-08 DIAGNOSIS — R29818 Other symptoms and signs involving the nervous system: Secondary | ICD-10-CM

## 2022-08-08 DIAGNOSIS — G4733 Obstructive sleep apnea (adult) (pediatric): Secondary | ICD-10-CM

## 2022-08-08 MED ORDER — LUBIPROSTONE 8 MCG PO CAPS: 8.0000 ug | ORAL_CAPSULE | Freq: Two times a day (BID) | ORAL | 3 refills | Status: DC

## 2022-08-08 NOTE — Telephone Encounter (Signed)
Per Vikki Ports, Trulance denied by [employer], Will send Amitiza 8 mcg Bid. Patient made aware of all and that new rx sent to her pharmacy for Amitiza 8 mcg bid.

## 2022-08-08 NOTE — Telephone Encounter (Signed)
Patient made aware.

## 2022-08-08 NOTE — Procedures (Signed)
       SLEEP STUDY REPORT Patient Information Study Date: 08/08/2022 Patient Name: [name] Patient ID: [id number] Birth Date: [birth year] Age: 46 Gender:  BMI: 46.2 (W=244 lb, H=5' 1'') Referring Physician: Claudina Lick, MD  TEST DESCRIPTION: Home sleep apnea testing was completed using the WatchPat, a Type 1 device, utilizing peripheral arterial tonometry (PAT), chest movement, actigraphy, pulse oximetry, pulse rate, body position and snore. AHI was calculated with apnea and hypopnea using valid sleep time as the denominator. RDI includes apneas, hypopneas, and RERAs. The data acquired and the scoring of sleep and all associated events were performed in accordance with the recommended standards and specifications as outlined in the AASM Manual for the Scoring of Sleep and Associated Events 2.2.0 (2015).   FINDINGS: 1.  No evidence of Obstructive Sleep Apnea with AHI 3.7/hr.  2.  No Central Sleep Apnea. 3.  Oxygen desaturations as low as 87%. 4.  Moderate snoring was present. O2 sats were < 88% for 0 minutes. 5.  Total sleep time was 3 hrs and 12 min. 6.  13.3% of total sleep time was spent in REM sleep.  7.  Normal sleep onset latency at 19 min.  8.  Normal REM sleep onset latency at 89 min.  9.  Total awakenings were 12.   DIAGNOSIS:  Normal study with no significant sleep disordered breathing.  RECOMMENDATIONS:   1. Normal study with no significant sleep disordered breathing.  2.  Healthy sleep recommendations include:  adequate nightly sleep (normal 7-9 hrs/night), avoidance of caffeine after noon and alcohol near bedtime, and maintaining a sleep environment that is cool, dark and quiet.  3.  Weight loss for overweight patients is recommended.    4.  Snoring recommendations include:  weight loss where appropriate, side sleeping, and avoidance of alcohol before bed.  5.  Operation of motor vehicle or dangerous equipment must be avoided when feeling drowsy,  excessively sleepy, or mentally fatigued.    6.  An ENT consultation which may be useful for specific causes of and possible treatment of bothersome snoring.   7. Weight loss may be of benefit in reducing the severity of snoring. 3 Signature:   Fransico Him, MD; [facility]; [place], [place] Board of Sleep Medicine Electronically Signed: 08/08/2022

## 2022-08-15 ENCOUNTER — Telehealth: Payer: Self-pay | Admitting: *Deleted

## 2022-08-15 NOTE — Telephone Encounter (Signed)
The patient has been notified of the result and verbalized understanding.  All questions (if any) were answered. Marolyn Hammock, [place] 08/15/2022 6:34 PM    Pt is aware and agreeable to normal results.

## 2022-08-15 NOTE — Telephone Encounter (Signed)
-----   Message from Lauralee Evener, [place] sent at 08/08/2022 11:55 AM EST -----  ----- Message ----- From: Sueanne Margarita, MD Sent: 08/08/2022  11:42 AM EST To: [facility] Div Sleep Studies  Please let patient know that sleep study showed no significant sleep apnea.

## 2022-09-15 ENCOUNTER — Encounter: Payer: Self-pay | Admitting: Internal Medicine

## 2022-09-15 ENCOUNTER — Ambulatory Visit: Payer: Medicaid Other | Attending: Internal Medicine | Admitting: Internal Medicine

## 2022-09-15 VITALS — BP 126/84 | HR 66 | Ht 61.0 in | Wt 238.8 lb

## 2022-09-15 DIAGNOSIS — R079 Chest pain, unspecified: Secondary | ICD-10-CM | POA: Diagnosis not present

## 2022-09-15 NOTE — Progress Notes (Signed)
Cardiology Office Note  Date: 09/15/2022   ID: [name], DOB 1975/10/13, MRN [id number]  PCP:  Glenda Chroman, MD  Cardiologist:  Chalmers Guest, MD Electrophysiologist:  None   Reason for Office Visit: Follow-up of chest pain   History of Present Illness: [name] is a 47 y.o. female known to have HTN presented to the clinic for follow up visit.  Patient was initially referred to cardiology clinic for chest pain evaluation. She underwent Echo that showed normal LVEF and no valvular abnormalities. NM stress test showed no evidence of ischemia. Home sleep study showed no evidence of OSA.  She was previously on metoprolol for HTN management which was switched to chlorthalidone in the last clinic visit. Patient is here for follow-up visit. She continues to have chest pains but thinks it could be more from her anxiety / panic attacks and probably pulled muscle as she had her grandkids come over last week and was playing with them.  Otherwise, no other symptoms like SOB, dizziness, lightheadedness, syncope and palpitations. She does have leg swelling if she is on her feet all day long and resolves the next day morning.  Past Medical History:  Diagnosis Date   Back complaints    Bipolar 1 disorder ([place])    Depression     Past Surgical History:  Procedure Laterality Date   ABDOMINAL HYSTERECTOMY     CARPAL TUNNEL RELEASE Right 2007   CHOLECYSTECTOMY     COLONOSCOPY WITH PROPOFOL N/A 03/16/2022   Procedure: COLONOSCOPY WITH PROPOFOL;  Surgeon: Harvel Quale, MD;  [facility];  Service: Gastroenterology;  Laterality: N/A;  1245 ASA 1   GALLBLADDER SURGERY     OVARIAN CYST REMOVAL     PARTIAL HYSTERECTOMY     POLYPECTOMY  03/16/2022   Procedure: POLYPECTOMY;  Surgeon: Harvel Quale, MD;  [facility]: [facility];  Service: Gastroenterology;;   TUBAL LIGATION      Current Outpatient Medications  Medication Sig Dispense Refill    Acetaminophen (TYLENOL ARTHRITIS PAIN PO) Take 650 mg by mouth as needed.     atorvastatin (LIPITOR) 20 MG tablet Take 1 tablet (20 mg total) by mouth daily. 30 tablet 1   chlorthalidone (HYGROTON) 25 MG tablet Take 1 tablet (25 mg total) by mouth daily. 30 tablet 3   cyclobenzaprine (FLEXERIL) 10 MG tablet Take 10 mg by mouth 3 (three) times daily as needed for muscle spasms.     indomethacin (INDOCIN) 25 MG capsule Take 1 capsule (25 mg total) by mouth 2 (two) times daily with a meal. 60 capsule 0   linaclotide (LINZESS) 145 MCG CAPS capsule Take by mouth as needed.     lubiprostone (AMITIZA) 8 MCG capsule Take 1 capsule (8 mcg total) by mouth 2 (two) times daily with a meal. 60 capsule 3   nitroGLYCERIN (NITROSTAT) 0.4 MG SL tablet Place 1 tablet (0.4 mg total) under the tongue every 5 (five) minutes x 3 doses as needed for chest pain (if no relief after 3rd dose, proceed to ED or call 911). 25 tablet 3   No current facility-administered medications for this visit.   Allergies:  Cortisone   Social History: The patient  reports that she quit smoking about 7 years ago. Her smoking use included cigarettes. She has been exposed to tobacco smoke. She has never used smokeless tobacco. She reports that she does not currently use alcohol. She reports current drug use. Drug: Marijuana.  Family History: The patient's family history includes Cancer in her mother; Diabetes in her mother; Heart disease in her father; Hypertension in her father and mother; Kidney cancer in her mother; Prostate cancer in her father.   ROS:  Please see the history of present illness. Otherwise, complete review of systems is positive for none.  All other systems are reviewed and negative.   Physical Exam: VS:  There were no vitals taken for this visit., BMI There is no height or weight on file to calculate BMI.  Wt Readings from Last 3 Encounters:  08/03/22 236 lb 11.2 oz (107.4 kg)  06/26/22 240 lb 8 oz (109.1 kg)   06/19/22 245 lb 6.4 oz (111.3 kg)    General: Patient appears comfortable at rest. HEENT: Conjunctiva and lids normal, oropharynx clear with moist mucosa. Neck: Supple, no elevated JVP or carotid bruits, no thyromegaly. Lungs: Clear to auscultation, nonlabored breathing at rest. Cardiac: Regular rate and rhythm, no S3 or significant systolic murmur, no pericardial rub. Abdomen: Soft, nontender, no hepatomegaly, bowel sounds present, no guarding or rebound. Extremities: Trace pitting edema, distal pulses 2+. Skin: Warm and dry. Musculoskeletal: No kyphosis. Neuropsychiatric: Alert and oriented x3, affect grossly appropriate.  ECG:  An ECG dated 06/19/2022 was personally reviewed today and demonstrated:  Normal sinus rhythm and no ST-T changes  Recent Labwork: 05/01/2022: ALT 42; AST 22; BUN 13; Creatinine, Ser 0.59; Hemoglobin 13.7; Platelets 188; Potassium 4.1; Sodium 138; TSH 1.250     Component Value Date/Time   CHOL 219 (H) 05/01/2022 1026   TRIG 180 (H) 05/01/2022 1026   HDL 39 (L) 05/01/2022 1026   CHOLHDL 5.6 05/01/2022 1026   VLDL 36 05/01/2022 1026   LDLCALC 144 (H) 05/01/2022 1026    Other Studies Reviewed Today: None  Assessment and Plan: Patient is a 47 year old F known to have HTN presented to the cardiology clinic for follow-up visit.  # Chest pain likely secondary to anxiety/panic attacks Plan -NM stress test showed no evidence of ischemia and Echo showed normal LVEF with no valvular abnormalities. Follow up with PCP for non-cardiac causes of chest pain.  #HTN, controlled Plan -Continue Chlorthalidone 25 mg once daily - follow-up with PCP for HTN management  #HLD, not at goal Plan -Continue atorvastatin 20 mg QHS. Goal LDL less than 100. -Follow-up with PCP for HLD management  I have spent a total of 33 minutes with patient reviewing chart, EKGs, labs and examining patient as well as establishing an assessment and plan that was discussed with the patient.   > 50% of time was spent in direct patient care.      Medication Adjustments/Labs and Tests Ordered: Current medicines are reviewed at length with the patient today.  Concerns regarding medicines are outlined above.   Tests Ordered: No orders of the defined types were placed in this encounter.   Medication Changes: No orders of the defined types were placed in this encounter.   Disposition:  Follow up  PRN  Signed Annalyn Blecher Fidel Levy, MD, 09/15/2022 8:00 AM    [facility], [place], [place] [id number]

## 2022-09-15 NOTE — Patient Instructions (Addendum)
Medication Instructions:   Your physician recommends that you continue on your current medications as directed. Please refer to the Current Medication list given to you today.  Labwork:  none  Testing/Procedures:  none  Follow-Up:  Your physician recommends that you schedule a follow-up appointment in: as needed.  Any Other Special Instructions Will Be Listed Below (If Applicable).  If you need a refill on your cardiac medications before your next appointment, please call your pharmacy. 

## 2022-09-19 NOTE — Progress Notes (Signed)
Note signed retroactively due to delayed EPIC access.  This is session # 1 with [name] for individual counseling for Anxiety.  Patient presents alone. BHP spent 55 minutes with patient.    Patient is presenting with anxious symptoms including autonomic hyperactivity, feelings of overwhelm, and racing thoughts related to environmental stressors, interpersonal patterns, and trauma hx. Patient will likely benefit from brief counseling services focusing on coping skills development and self-care, and trauma-informed services.   This procedure has been fully reviewed with the patient and informed consent has been obtained.    Patient location: Pt seen in clinic through telehealth.  I connected with  [name] on 05/30/22 by a video enabled telemedicine application and verified that I am speaking with the correct person.   I discussed the limitations of evaluation and management by telemedicine. The patient expressed understanding and agreed to proceed.   ASSESSMENT AND PLAN  Current diagnosis:  Anxiety  We created the following treatment plan at today's appointment:              1)  05/30/2022              2)  Decrease anxious symptoms through increased coping skills, support, and processing, maintain mood with seasonal changes, and increase overall confidence and life satisfaction.              3)  No discussion of medication desired at this time.              4)  Next screening due in 4 sessions.   HISTORY OF PRESENT ILLNESS  Mental Status: [name] presented oriented X4. Pt mood and affect were anxious.  Judgment was appropriate. Memory was appropriate. Thought processes and content were appropriate. Patient denies SI/HI.    Narrative: Met with pt for scheduled appointment. Person-centered approach utilized to begin therapeutic alliance. Explored presenting concern, symptoms, hx, supports, and goals. Used assertiveness education around saying "no", to name goals of boundaries  and self-care. Practiced mindfulness-based activity using guided imagery.to ground and regulate. Assessed for any other concerns pt wanted to address today, which they denied. Scheduled follow up in 2 weeks.  Effectiveness of the intervention(s) and the beneficiary's response or progress toward goal(s):  Patient is in a contemplative stage of change to engage in treatment plan.

## 2022-10-19 ENCOUNTER — Encounter: Payer: Self-pay | Admitting: Radiology

## 2022-11-30 ENCOUNTER — Other Ambulatory Visit: Payer: Self-pay | Admitting: Internal Medicine

## 2023-02-05 ENCOUNTER — Encounter (INDEPENDENT_AMBULATORY_CARE_PROVIDER_SITE_OTHER): Payer: Self-pay | Admitting: Gastroenterology

## 2023-02-05 ENCOUNTER — Ambulatory Visit (INDEPENDENT_AMBULATORY_CARE_PROVIDER_SITE_OTHER): Payer: Medicaid Other | Admitting: Gastroenterology

## 2023-02-05 VITALS — BP 112/77 | HR 76 | Temp 98.6°F | Ht 61.0 in | Wt 226.4 lb

## 2023-02-05 DIAGNOSIS — K5900 Constipation, unspecified: Secondary | ICD-10-CM

## 2023-02-05 MED ORDER — LUBIPROSTONE 24 MCG PO CAPS: 24.0000 ug | ORAL_CAPSULE | Freq: Two times a day (BID) | ORAL | 3 refills | Status: DC

## 2023-02-05 NOTE — Progress Notes (Addendum)
Referring Provider: No ref. provider found Primary Care Physician:  Ignatius Specking, MD Primary GI Physician: Levon Hedger   Chief Complaint  Patient presents with   Constipation    Follow up on constipation, rectal bleeding and hemorrhoids. Taking amitiza bid and still having some issues having BM. Goes several times per day but never feels like she empties completely. Has not seen any rectal bleeding.    HPI:   [name] is a 47 y.o. female with past medical history of bipolar disorder 1, depression    Patient presenting today for follow up of constipation.  Last seen December, at that time doing better on linzess, taking linzess once a week as she is using samples, previously didn't have insurance so she could not get the prescription.not using anything else for constipation, straining a lot to go as she does not feel normal if she does not go every day. Still having some rectal bleeding, most of the times when she defecates or worse if she has not gone for a day or so. Marland Kitchen   Recommended good water intake, trulance 3mg  daily, avoid straining Trulance denied by insurance, sent amitiza BID  Present:  Patient states constipation is improved though still having to sit for a while to have a BM. She notes she is having little stool balls but straining is improved. Noting multiple smaller BMs per day. She still does not feel she is emptying out as well as she wishes. Has some occasional rectal bleeding but much less than before. No abdominal pain. She is doing semiglutide and has had decrease in appetite. Water intake is good. No melena, nausea, vomiting, dysphagia.   Recent labs in September with TSH 1.25, CMP normal other than slightly low calcium   Last Colonoscopy: 02/2022 examined ileum normal - One 4 mm polyp in the sigmoid colon, removed with a cold snare. Tubular adenoma - Diverticulosis in the sigmoid colon. - Non-bleeding internal hemorrhoids. Last Endoscopy:    Recommendations:  Repeat in 7 years     Past Medical History:  Diagnosis Date   Back complaints    Bipolar 1 disorder ([facility])    Depression     Past Surgical History:  Procedure Laterality Date   ABDOMINAL HYSTERECTOMY     CARPAL TUNNEL RELEASE Right 2007   CHOLECYSTECTOMY     COLONOSCOPY WITH PROPOFOL N/A 03/16/2022   Procedure: COLONOSCOPY WITH PROPOFOL;  Surgeon: Dolores Frame, MD;  [facility];  Service: Gastroenterology;  Laterality: N/A;  1245 ASA 1   GALLBLADDER SURGERY     OVARIAN CYST REMOVAL     PARTIAL HYSTERECTOMY     POLYPECTOMY  03/16/2022   Procedure: POLYPECTOMY;  Surgeon: Dolores Frame, MD;  [facility];  Service: Gastroenterology;;   TUBAL LIGATION      Current Outpatient Medications  Medication Sig Dispense Refill   Acetaminophen (TYLENOL ARTHRITIS PAIN PO) Take 650 mg by mouth as needed.     atorvastatin (LIPITOR) 20 MG tablet Take 1 tablet (20 mg total) by mouth daily. 30 tablet 1   chlorthalidone (HYGROTON) 25 MG tablet Take 1 tablet by mouth once daily 90 tablet 3   DULoxetine (CYMBALTA) 30 MG capsule Take 30 mg by mouth daily.     indomethacin (INDOCIN) 25 MG capsule Take 1 capsule (25 mg total) by mouth 2 (two) times daily with a meal. 60 capsule 0   lubiprostone (AMITIZA) 8 MCG capsule Take 1 capsule (8 mcg total)  by mouth 2 (two) times daily with a meal. 60 capsule 3   nitroGLYCERIN (NITROSTAT) 0.4 MG SL tablet Place 1 tablet (0.4 mg total) under the tongue every 5 (five) minutes x 3 doses as needed for chest pain (if no relief after 3rd dose, proceed to ED or call 911). 25 tablet 3   SEMAGLUTIDE PO Take by mouth. 20 units once a week     No current facility-administered medications for this visit.    Allergies as of 02/05/2023 - Review Complete 02/05/2023  Allergen Reaction Noted   Cortisone Swelling and Rash 05/24/2021    Family History  Problem Relation Age of Onset   Cancer Mother    Kidney  cancer Mother    Hypertension Mother    Diabetes Mother    Heart disease Father    Hypertension Father    Prostate cancer Father     Social History   Socioeconomic History   Marital status: Married    Spouse name: Not on file   Number of children: Not on file   Years of education: Not on file   Highest education level: Not on file  Occupational History   Not on file  Tobacco Use   Smoking status: Former    Types: Cigarettes    Quit date: 05/22/2015    Years since quitting: 7.7    Passive exposure: Past   Smokeless tobacco: Never  Vaping Use   Vaping Use: Former  Substance and Sexual Activity   Alcohol use: Not Currently    Comment: history social etoh   Drug use: Yes    Types: 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   Sexual activity: Not on file  Other Topics Concern   Not on file  Social History Narrative   Not on file   Social Determinants of Health   Financial Resource Strain: Not on file  Food Insecurity: Not on file  Transportation Needs: Not on file  Physical Activity: Not on file  Stress: Not on file  Social Connections: Not on file   Review of systems General: negative for malaise, night sweats, fever, chills, weight loss Neck: Negative for lumps, goiter, pain and significant neck swelling Resp: Negative for cough, wheezing, dyspnea at rest CV: Negative for chest pain, leg swelling, palpitations, orthopnea GI: denies melena, hematochezia, nausea, vomiting, diarrhea, dysphagia, odyonophagia, early satiety or unintentional weight loss. +constipation  MSK: Negative for joint pain or swelling, back pain, and muscle pain. Derm: Negative for itching or rash Psych: Denies depression, anxiety, memory loss, confusion. No homicidal or suicidal ideation.  Heme: Negative for prolonged bleeding, bruising easily, and swollen nodes. Endocrine: Negative for cold or heat intolerance, polyuria, polydipsia and goiter. Neuro: negative for tremor, gait  imbalance, syncope and seizures. The remainder of the review of systems is noncontributory.  Physical Exam: BP 112/77 (BP Location: Left Arm, Patient Position: Sitting, Cuff Size: Large)   Pulse 76   Temp 98.6 F (37 C) (Oral)   Ht 5\' 1"  (1.549 m)   Wt 226 lb 6.4 oz (102.7 kg)   BMI 42.78 kg/m  General:   Alert and oriented. No distress noted. Pleasant and cooperative.  Head:  Normocephalic and atraumatic. Eyes:  Conjuctiva clear without scleral icterus. Mouth:  Oral mucosa pink and moist. Good dentition. No lesions. Heart: Normal rate and rhythm, s1 and s2 heart sounds present.  Lungs: Clear lung sounds in all lobes. Respirations equal and unlabored. Abdomen:  +BS, soft, non-tender  and non-distended. No rebound or guarding. No HSM or masses noted. Derm: No palmar erythema or jaundice Msk:  Symmetrical without gross deformities. Normal posture. Extremities:  Without edema. Neurologic:  Alert and  oriented x4 Psych:  Alert and cooperative. Normal mood and affect.  Invalid input(s): "6 MONTHS"   ASSESSMENT: [name] is a 47 y.o. female presenting today for follow up of Constipation  Currently on amitiza BID and doing some better than previously, though still noting harder stool balls and smaller, more frequent BMs. Water intake is good. She has noticed some hair fall and feels this started after initiation of amitiza, however, she notes it is very mild (last TSH WNL). We discussed increasing dose of amitiza vs trial of Ibsrela, however, patient would like to try higher dose of amitiza at this time, as she has noticed some improvement with it. Will start amitiza once daily to begin with, to hopefully avoid further hair loss, she can increase to BID if needed. If she continues to have hair fall or is not having improvement in constipation, will consider switching to [place].    PLAN:  Continue with good water intake  2. Increase amitiza to (can start with  daily and increase to BID if needed)  3. Consider Allena Napoleon if constipation not improved   All questions were answered, patient verbalized understanding and is in agreement with plan as outlined above.   Follow Up: 6 months   Samarth Ogle L. Jeanmarie Hubert, MSN, APRN, AGNP-C Adult-Gerontology Nurse Practitioner [facility]  I have reviewed the note and agree with the APP's assessment as described in this progress note  If no improvement with amitiza, may want to proceed with anorectal manometry first  Katrinka Blazing, MD Gastroenterology and Hepatology [facility]

## 2023-02-05 NOTE — Patient Instructions (Signed)
Increase water intake, aim for atleast 64 oz per day Increase fruits, veggies and whole grains, kiwi and prunes are especially good for constipation We will increase amitiza to , you can start with once daily and go up to twice a daily if needed If this is not working or you have increased hair loss, please let me know  Follow up 6 months  It was a pleasure to see you today. I want to create trusting relationships with patients and provide genuine, compassionate, and quality care. I truly value your feedback! please be on the lookout for a survey regarding your visit with me today. I appreciate your input about our visit and your time in completing this!    Keiondre Colee L. Jeanmarie Hubert, MSN, APRN, AGNP-C Adult-Gerontology Nurse Practitioner [facility] Gastroenterology at [facility]

## 2023-02-27 ENCOUNTER — Other Ambulatory Visit: Payer: Self-pay | Admitting: Internal Medicine

## 2023-02-27 ENCOUNTER — Ambulatory Visit
Admission: RE | Admit: 2023-02-27 | Discharge: 2023-02-27 | Disposition: A | Discharge status: Home / Self Care | Payer: Medicaid Other | Source: Ambulatory Visit | Attending: Internal Medicine | Admitting: Internal Medicine

## 2023-02-27 DIAGNOSIS — Z1231 Encounter for screening mammogram for malignant neoplasm of breast: Secondary | ICD-10-CM

## 2023-05-10 ENCOUNTER — Ambulatory Visit: Payer: Medicaid Other | Admitting: Neurology

## 2023-05-10 ENCOUNTER — Encounter: Payer: Self-pay | Admitting: Neurology

## 2023-05-10 VITALS — Resp 15 | Ht 61.5 in | Wt 207.5 lb

## 2023-05-10 DIAGNOSIS — R42 Dizziness and giddiness: Secondary | ICD-10-CM

## 2023-05-10 NOTE — Progress Notes (Signed)
[facility]  PATIENT: [name] DOB: 29-Apr-1976  REQUESTING CLINICIAN: Orlene Plum, NP HISTORY FROM: Patient  REASON FOR VISIT: Intermittent vertigo    HISTORICAL  CHIEF COMPLAINT:  Chief Complaint  Patient presents with   New Patient (Initial Visit)    Rm13, alone, referral for Dizziness (ongoing 87months)thinks voltaren gel caused it  and tinnitus (ongoing many yearss) Pt stated size of her hand there is fuzzy feeling and sharp pain (ongoing 6 months)    HISTORY OF PRESENT ILLNESS:  This is a 47 year old woman past medical history of hyperlipidemia, obesity who is presenting with intermittent vertigo for the past 6 months.  Patient reports the symptoms usually happen at night when she lays on the bed.  She will turn to the left and have a severe room spinning sensation associated with nausea and vomiting.  During this time she has difficulty walking.  Episode can last 5 to 10-minutes.  She has also experienced tinnitus in the left ear and left ear hearing loss.  Denies any headaches, but reports a fuzzy feeling on the left side of her head, no change in vision, no loss of vision.  Denies any focal neurological deficit.    OTHER MEDICAL CONDITIONS: Hyperlipidemia, Obesity    REVIEW OF SYSTEMS: Full 14 system review of systems performed and negative with exception of: As noted in the HPI   ALLERGIES: Allergies  Allergen Reactions   Cortisone Swelling and Rash    Cortisone shot    HOME MEDICATIONS: Outpatient Medications Prior to Visit  Medication Sig Dispense Refill   Acetaminophen (TYLENOL ARTHRITIS PAIN PO) Take 650 mg by mouth as needed.     atorvastatin (LIPITOR) 20 MG tablet Take 1 tablet (20 mg total) by mouth daily. 30 tablet 1   indomethacin (INDOCIN) 25 MG capsule Take 1 capsule (25 mg total) by mouth 2 (two) times daily with a meal. 60 capsule 0   nitroGLYCERIN (NITROSTAT) 0.4 MG SL tablet Place 1 tablet (0.4 mg total) under  the tongue every 5 (five) minutes x 3 doses as needed for chest pain (if no relief after 3rd dose, proceed to ED or call 911). 25 tablet 3   oxybutynin (DITROPAN-XL) 10 MG 24 hr tablet Take 10 mg by mouth daily.     SEMAGLUTIDE PO Take 50 Units by mouth every 7 (seven) days. 20 units once a week     chlorthalidone (HYGROTON) 25 MG tablet Take 1 tablet by mouth once daily 90 tablet 3   DULoxetine (CYMBALTA) 30 MG capsule Take 30 mg by mouth daily.     lubiprostone (AMITIZA) 24 MCG capsule Take 1 capsule (24 mcg total) by mouth 2 (two) times daily with a meal. 60 capsule 3   No facility-administered medications prior to visit.    PAST MEDICAL HISTORY: Past Medical History:  Diagnosis Date   Back complaints    Bipolar 1 disorder (HCC)    Depression     PAST SURGICAL HISTORY: Past Surgical History:  Procedure Laterality Date   ABDOMINAL HYSTERECTOMY     CARPAL TUNNEL RELEASE Right 2007   CHOLECYSTECTOMY     COLONOSCOPY WITH PROPOFOL N/A 03/16/2022   Procedure: COLONOSCOPY WITH PROPOFOL;  Surgeon: Dolores Frame, MD;  Location: [facility];  Service: Gastroenterology;  Laterality: N/A;  1245 ASA 1   GALLBLADDER SURGERY     OVARIAN CYST REMOVAL     PARTIAL HYSTERECTOMY     POLYPECTOMY  03/16/2022   Procedure: POLYPECTOMY;  Surgeon: Marguerita Merles, Reuel Boom, MD;  Location: [facility];  Service: Gastroenterology;;   TUBAL LIGATION      FAMILY HISTORY: Family History  Problem Relation Age of Onset   Cancer Mother    Kidney cancer Mother    Hypertension Mother    Diabetes Mother    Heart disease Father    Hypertension Father    Prostate cancer Father    Breast cancer Neg Hx     SOCIAL HISTORY: Social History   Socioeconomic History   Marital status: Married    Spouse name: Hydrologist   Number of children: 4   Years of education: Not on file   Highest education level: 9th grade  Occupational History   Not on file  Tobacco Use   Smoking status: Former     Current packs/day: 0.00    Types: Cigarettes    Quit date: 05/22/2015    Years since quitting: 7.9    Passive exposure: Past   Smokeless tobacco: Never  Vaping Use   Vaping status: Former  Substance and Sexual Activity   Alcohol use: Not Currently    Comment: history social etoh   Drug use: Yes    Types: 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   Sexual activity: Not on file  Other Topics Concern   Not on file  Social History Narrative   Not on file   Social Determinants of Health   Financial Resource Strain: Not on file  Food Insecurity: Not on file  Transportation Needs: Not on file  Physical Activity: Not on file  Stress: Not on file  Social Connections: Not on file  Intimate Partner Violence: Not on file    PHYSICAL EXAM  GENERAL EXAM/CONSTITUTIONAL: Vitals:  Vitals:   05/10/23 0804 05/10/23 0806  Resp:  15  SpO2:  93%  Weight: 207 lb 8 oz (94.1 kg) 207 lb 8 oz (94.1 kg)  Height: 5' 1.5" (1.562 m) 5' 1.5" (1.562 m)   Body mass index is 38.57 kg/m. Wt Readings from Last 3 Encounters:  05/10/23 207 lb 8 oz (94.1 kg)  02/05/23 226 lb 6.4 oz (102.7 kg)  09/15/22 238 lb 12.8 oz (108.3 kg)   Patient is in no distress; well developed, nourished and groomed; neck is supple  MUSCULOSKELETAL: Gait, strength, tone, movements noted in Neurologic exam below  NEUROLOGIC: MENTAL STATUS:      No data to display         awake, alert, oriented to person, place and time recent and remote memory intact normal attention and concentration language fluent, comprehension intact, naming intact fund of knowledge appropriate  CRANIAL NERVE:  2nd, 3rd, 4th, 6th - Visual fields full to confrontation, extraocular muscles intact, no nystagmus. Negative HINTS 5th - facial sensation symmetric 7th - facial strength symmetric 8th - hearing intact 9th - palate elevates symmetrically, uvula midline 11th - shoulder shrug symmetric 12th - tongue protrusion  midline  MOTOR:  normal bulk and tone, full strength in the BUE, BLE  SENSORY:  normal and symmetric to light touch  COORDINATION:  finger-nose-finger, fine finger movements normal  GAIT/STATION:  normal    DIAGNOSTIC DATA (LABS, IMAGING, TESTING) - I reviewed patient records, labs, notes, testing and imaging myself where available.  Lab Results  Component Value Date   WBC 5.2 05/01/2022   HGB 13.7 05/01/2022   HCT 41.5 05/01/2022   MCV 88.5 05/01/2022   PLT 188 05/01/2022  Component Value Date/Time   NA 138 05/01/2022 1025   K 4.1 05/01/2022 1025   CL 107 05/01/2022 1025   CO2 24 05/01/2022 1025   GLUCOSE 98 05/01/2022 1025   BUN 13 05/01/2022 1025   CREATININE 0.59 05/01/2022 1025   CALCIUM 8.6 (L) 05/01/2022 1025   PROT 6.9 05/01/2022 1025   ALBUMIN 4.1 05/01/2022 1025   AST 22 05/01/2022 1025   ALT 42 05/01/2022 1025   ALKPHOS 71 05/01/2022 1025   BILITOT 0.4 05/01/2022 1025   GFRNONAA >60 05/01/2022 1025   Lab Results  Component Value Date   CHOL 219 (H) 05/01/2022   HDL 39 (L) 05/01/2022   LDLCALC 144 (H) 05/01/2022   TRIG 180 (H) 05/01/2022   CHOLHDL 5.6 05/01/2022   Lab Results  Component Value Date   HGBA1C 5.2 05/31/2021   No results found for: "VITAMINB12" Lab Results  Component Value Date   TSH 1.250 05/01/2022     ASSESSMENT AND PLAN  47 y.o. year old female with history of obesity, hyperlipidemia who is presenting with intermittent vertigo described as room spinning sensation for the past 58-months.  It is triggered by sudden movement to the left, and associated with nausea, vomiting,  and difficulty walking.  There also presence of left hearing loss and tinnitus.  While I do suspect a benign peripheral positional vertigo, I will get a MRI brain to rule out intracranial pathology.  I will contact the patient to go over the result.  If MRI is negative and patient continues to be symptomatic, we will refer her to vestibular therapy.   She is comfortable with plans.  Continue to follow with PCP and return as needed.   1. Vertigo      Patient Instructions  MRI brain with and without contrast, I will contact you to go over the results If MRI brain is negative and you continue to have symptoms, will refer you to vestibular therapy Continue to follow with PCP return as needed  Orders Placed This Encounter  Procedures   MR BRAIN W WO CONTRAST    No orders of the defined types were placed in this encounter.   Return if symptoms worsen or fail to improve.    Windell Norfolk, MD 05/10/2023, 8:45 AM  [facility] [address], [address] [place], [place] [address] [phone]

## 2023-05-10 NOTE — Patient Instructions (Signed)
MRI brain with and without contrast, I will contact you to go over the results If MRI brain is negative and you continue to have symptoms, will refer you to vestibular therapy Continue to follow with PCP return as needed

## 2023-05-16 ENCOUNTER — Telehealth: Payer: Self-pay | Admitting: Neurology

## 2023-05-16 NOTE — Telephone Encounter (Signed)
[facility] [name]: [id number] exp. 05/16/23-07/13/23 sent to GI [phone]

## 2023-05-28 ENCOUNTER — Ambulatory Visit
Admission: RE | Admit: 2023-05-28 | Discharge: 2023-05-28 | Disposition: A | Discharge status: Home / Self Care | Payer: Medicaid Other | Source: Ambulatory Visit | Attending: Neurology

## 2023-05-28 DIAGNOSIS — R42 Dizziness and giddiness: Secondary | ICD-10-CM

## 2023-05-28 MED ORDER — GADOPICLENOL 0.5 MMOL/ML IV SOLN
10.0000 mL | Freq: Once | INTRAVENOUS | Status: AC | PRN
  Administered 2023-05-28: 10 mL via INTRAVENOUS

## 2023-06-04 ENCOUNTER — Other Ambulatory Visit: Payer: Self-pay | Admitting: Neurology

## 2023-06-04 DIAGNOSIS — R42 Dizziness and giddiness: Secondary | ICD-10-CM

## 2023-06-11 ENCOUNTER — Ambulatory Visit (HOSPITAL_COMMUNITY): Payer: Medicaid Other

## 2023-07-09 ENCOUNTER — Encounter (HOSPITAL_COMMUNITY): Payer: Medicaid Other

## 2023-08-02 ENCOUNTER — Telehealth (HOSPITAL_COMMUNITY): Payer: Self-pay

## 2023-08-02 NOTE — Telephone Encounter (Signed)
08/06/23 appt confirmed

## 2023-08-06 ENCOUNTER — Ambulatory Visit (HOSPITAL_COMMUNITY): Payer: Medicaid Other | Admitting: Psychiatry

## 2023-08-06 ENCOUNTER — Encounter (HOSPITAL_COMMUNITY): Payer: Self-pay | Admitting: Psychiatry

## 2023-08-06 DIAGNOSIS — F4001 Agoraphobia with panic disorder: Secondary | ICD-10-CM | POA: Diagnosis not present

## 2023-08-06 DIAGNOSIS — F1290 Cannabis use, unspecified, uncomplicated: Secondary | ICD-10-CM

## 2023-08-06 DIAGNOSIS — X779XXA Intentional self-harm by unspecified hot objects, initial encounter: Secondary | ICD-10-CM

## 2023-08-06 DIAGNOSIS — F603 Borderline personality disorder: Secondary | ICD-10-CM

## 2023-08-06 DIAGNOSIS — Z8659 Personal history of other mental and behavioral disorders: Secondary | ICD-10-CM | POA: Insufficient documentation

## 2023-08-06 DIAGNOSIS — F502 Bulimia nervosa, unspecified: Secondary | ICD-10-CM | POA: Insufficient documentation

## 2023-08-06 DIAGNOSIS — Z9151 Personal history of suicidal behavior: Secondary | ICD-10-CM

## 2023-08-06 DIAGNOSIS — F411 Generalized anxiety disorder: Secondary | ICD-10-CM

## 2023-08-06 DIAGNOSIS — F5021 Bulimia nervosa, mild: Secondary | ICD-10-CM

## 2023-08-06 MED ORDER — RISPERIDONE 0.25 MG PO TABS
0.2500 mg | ORAL_TABLET | Freq: Every day | ORAL | 1 refills | Status: DC
Start: 2023-08-06 — End: 2023-10-05

## 2023-08-06 MED ORDER — SERTRALINE HCL 100 MG PO TABS: 100.0000 mg | ORAL_TABLET | Freq: Every day | ORAL | 2 refills | Status: DC

## 2023-08-06 MED ORDER — BUSPIRONE HCL 10 MG PO TABS
10.0000 mg | ORAL_TABLET | Freq: Three times a day (TID) | ORAL | 2 refills | Status: AC
Start: 2023-08-06 — End: ?

## 2023-08-06 NOTE — Patient Instructions (Addendum)
We started risperidone 0.25 mg nightly today.  We otherwise kept the sertraline and BuSpar the same for now.  Please coordinate with your PCP for a nutrition referral, vitamin D, B12, folate, iron panel, blind weights, orthostatic vital signs.  In the meantime, please contact your insurer for a psychotherapist that is in network that practices cognitive behavioral therapy (CBT) and preferably someone with trauma experience.  It can be harder to find someone with disordered eating experience but that would be helpful as well.  The best treatment for borderline personality disorder is DBT (dialectical behavioral therapy) but it can be difficult to find someone who practices this.  In the meantime please utilize the workbook which can be found here: https://www.mosley.info/.pdf  Stopping the edibles will also likely help some of the irritability and anger and big mood swings.  You may want to get a pain management referral from your PCP as they may have alternative options for you.
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
## 2023-08-07 ENCOUNTER — Ambulatory Visit (INDEPENDENT_AMBULATORY_CARE_PROVIDER_SITE_OTHER): Payer: Medicaid Other | Admitting: Gastroenterology

## 2023-08-07 VITALS — BP 126/84 | HR 86 | Temp 97.7°F | Ht 61.5 in | Wt 200.8 lb

## 2023-08-07 DIAGNOSIS — K5900 Constipation, unspecified: Secondary | ICD-10-CM

## 2023-08-07 DIAGNOSIS — K219 Gastro-esophageal reflux disease without esophagitis: Secondary | ICD-10-CM | POA: Insufficient documentation

## 2023-08-07 DIAGNOSIS — Z8719 Personal history of other diseases of the digestive system: Secondary | ICD-10-CM | POA: Diagnosis not present

## 2023-08-07 MED ORDER — SUCRALFATE 1 GM/10ML PO SUSP: 1.0000 g | Freq: Four times a day (QID) | ORAL | 1 refills | Status: AC

## 2023-08-07 NOTE — Patient Instructions (Signed)
Take omeprazole 20mg  daily   Start Carafate 1g before meals and at bedtime  Continue with laxative, Increase water intake, aim for atleast 64 oz per day Increase fruits, veggies and whole grains, kiwi and prunes are especially good for constipation You can use over the counter preparation H for hemorrhoids We may consider upper endoscopy if symptoms are not improving  Follow up 6 weeks  It was a pleasure to see you today. I want to create trusting relationships with patients and provide genuine, compassionate, and quality care. I truly value your feedback! please be on the lookout for a survey regarding your visit with me today. I appreciate your input about our visit and your time in completing this!    Sherril Shipman L. Jeanmarie Hubert, MSN, APRN, AGNP-C Adult-Gerontology Nurse Practitioner [facility] at [facility]

## 2023-08-07 NOTE — Progress Notes (Addendum)
Referring Provider: Ignatius Specking, MD Primary Care Physician:  Ignatius Specking, MD Primary GI Physician: Dr. Levon Hedger   Chief Complaint  Patient presents with   Constipation    Follow up on constipation. Stopped amitiza because hair was following out. Takes dulcolax about once a week and states doing well with that.    Gastroesophageal Reflux    Having gas, reflux, belching. Taking omeprazole 20mg  as needed and wants to discuss increasing to daily. Symptoms started after starting semaglutide.    HPI:   [name] is a 47 y.o. female with past medical history of bipolar 1 disorder, depression   Patient presenting today for follow up of constipation and GERD   Last seen June, at that time constipation improved some though still havign to sit a while to defecate. Having harder stool balls. Occasiona rectal bleeding though less than previously.   Recommended to increase amitiza daily to BID, consider ibsrela/anorectal manometry if not improving   Present:  States she is having more belching and abdominal upset over the past few months. She notes sulfur type belches in the past, previously could vomit and make herself feel better. Notes burps are not sulfur like now but she has a bad taste in her mouth. Laying down causes acid regurgitation.  PCP started her on omeprazole 20mg  which she is only taking here and there because PCP told her not to take daily. She notes she feels that food is sitting in her stomach. She did vomit Monday and saw undigested food from Sunday. Has recently started zoloft and buspar. Has been on semaglutide for about 1 year. Denies dysphagia. Has had weight loss secondary to semaglutide. States symptoms do not seem to depend on what she eats. She has some upper abdominal discomfort. She takes ibuprofen 800mg  daily, only taking this for about 2 weeks for shoulder pain. She has blood in her stools with straining. Denies melena. Had some darker stools a while back  when taking pepto bismol but this stopped once she stopped consuming that.   Stopped amitiza as she noted her hair falling out from this. She is taking one laxative per week and feeling that she gets pretty cleaned out with this. Usually having a BM daily. She has some occasional rectal bleeding but no pain. She is straining to defecate at times.    Last Colonoscopy: 02/2022 examined ileum normal - One 4 mm polyp in the sigmoid colon, removed with a cold snare. Tubular adenoma - Diverticulosis in the sigmoid colon. - Non-bleeding internal hemorrhoids. Last Endoscopy: never    Recommendations:  Repeat in 7 years    Past Medical History:  Diagnosis Date   Back complaints    Bipolar 1 disorder (HCC)    Depression     Past Surgical History:  Procedure Laterality Date   ABDOMINAL HYSTERECTOMY     CARPAL TUNNEL RELEASE Right 2007   CHOLECYSTECTOMY     COLONOSCOPY WITH PROPOFOL N/A 03/16/2022   Procedure: COLONOSCOPY WITH PROPOFOL;  Surgeon: Dolores Frame, MD;  [facility];  Service: Gastroenterology;  Laterality: N/A;  1245 ASA 1   GALLBLADDER SURGERY     OVARIAN CYST REMOVAL     PARTIAL HYSTERECTOMY     POLYPECTOMY  03/16/2022   Procedure: POLYPECTOMY;  Surgeon: Dolores Frame, MD;  [facility]: [facility];  Service: Gastroenterology;;   TUBAL LIGATION      Current Outpatient Medications  Medication Sig Dispense Refill   Acetaminophen (TYLENOL  ARTHRITIS PAIN PO) Take 650 mg by mouth as needed.     atorvastatin (LIPITOR) 20 MG tablet Take 1 tablet (20 mg total) by mouth daily. 30 tablet 1   busPIRone (BUSPAR) 10 MG tablet Take 1 tablet (10 mg total) by mouth 3 (three) times daily. 90 tablet 2   indomethacin (INDOCIN) 25 MG capsule Take 1 capsule (25 mg total) by mouth 2 (two) times daily with a meal. 60 capsule 0   nitroGLYCERIN (NITROSTAT) 0.4 MG SL tablet Place 1 tablet (0.4 mg total) under the tongue every 5 (five) minutes x 3 doses as needed  for chest pain (if no relief after 3rd dose, proceed to ED or call 911). 25 tablet 3   omeprazole (PRILOSEC) 20 MG capsule Take 20 mg by mouth daily.     SEMAGLUTIDE PO Take 50 Units by mouth every 7 (seven) days. 20 units once a week     sertraline (ZOLOFT) 100 MG tablet Take 1 tablet (100 mg total) by mouth daily. 30 tablet 2   risperiDONE (RISPERDAL) 0.25 MG tablet Take 1 tablet (0.25 mg total) by mouth at bedtime. (Patient not taking: Reported on 08/07/2023) 30 tablet 1   No current facility-administered medications for this visit.    Allergies as of 08/07/2023 - Review Complete 08/07/2023  Allergen Reaction Noted   Cortisone Swelling and Rash 05/24/2021    Family History  Problem Relation Age of Onset   Cancer Mother    Kidney cancer Mother    Hypertension Mother    Diabetes Mother    Heart disease Father    Hypertension Father    Prostate cancer Father    Breast cancer Neg Hx     Social History   Socioeconomic History   Marital status: Married    Spouse name: Hydrologist   Number of children: 4   Years of education: Not on file   Highest education level: 9th grade  Occupational History   Not on file  Tobacco Use   Smoking status: Former    Current packs/day: 0.00    Types: Cigarettes    Quit date: 05/22/2015    Years since quitting: 8.2    Passive exposure: Past   Smokeless tobacco: Never  Vaping Use   Vaping status: Former  Substance and Sexual Activity   Alcohol use: Not Currently    Comment: history social etoh 1 glass of wine every other month   Drug use: Yes    Types: Marijuana    Comment: MJ daily as edibles.  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   Sexual activity: Not on file  Other Topics Concern   Not on file  Social History Narrative   Not on file   Social Drivers of Health   Financial Resource Strain: Not on file  Food Insecurity: Not on file  Transportation Needs: Not on file  Physical Activity: Not on file  Stress: Not on file  Social Connections: Not on  file    Review of systems General: negative for malaise, night sweats, fever, chills, weight loss Neck: Negative for lumps, goiter, pain and significant neck swelling Resp: Negative for cough, wheezing, dyspnea at rest CV: Negative for chest pain, leg swelling, palpitations, orthopnea GI: denies melena, hematochezia, nausea, vomiting, diarrhea, constipation, dysphagia, odyonophagia, early satiety or unintentional weight loss. +heartburn +belching  MSK: Negative for joint pain or swelling, back pain, and muscle pain. Derm: Negative for itching or rash Psych: Denies depression, anxiety, memory loss, confusion. No homicidal or suicidal ideation.  Heme: Negative for prolonged bleeding, bruising easily, and swollen nodes. Endocrine: Negative for cold or heat intolerance, polyuria, polydipsia and goiter. Neuro: negative for tremor, gait imbalance, syncope and seizures. The remainder of the review of systems is noncontributory.  Physical Exam: BP 126/84 (BP Location: Left Arm, Patient Position: Sitting)   Pulse 86   Temp 97.7 F (36.5 C) (Oral)   Ht 5' 1.5" (1.562 m)   Wt 200 lb 12.8 oz (91.1 kg)   BMI 37.33 kg/m  General:   Alert and oriented. No distress noted. Pleasant and cooperative.  Head:  Normocephalic and atraumatic. Eyes:  Conjuctiva clear without scleral icterus. Mouth:  Oral mucosa pink and moist. Good dentition. No lesions. Heart: Normal rate and rhythm, s1 and s2 heart sounds present.  Lungs: Clear lung sounds in all lobes. Respirations equal and unlabored. Abdomen:  +BS, soft, non-tender and non-distended. No rebound or guarding. No HSM or masses noted. Derm: No palmar erythema or jaundice Msk:  Symmetrical without gross deformities. Normal posture. Extremities:  Without edema. Neurologic:  Alert and  oriented x4 Psych:  Alert and cooperative. Normal mood and affect.  Invalid input(s): "6 MONTHS"   ASSESSMENT: [name] is a 47 y.o. female presenting today  for follow up of constipation and with GERD/Belching  Constipation: Previously on Amitiza though she stopped this as she felt that her hair was falling out secondary to the medication.  She is currently taking a laxative usually once per week and having a BM usually every day.  She does note needing to strain at times still.  History of hemorrhoids and recent colonoscopy in 2023, she has some rectal bleeding and some itching at times likely secondary to her known hemorrhoids.  She is not currently use anything for these.  Recommend she try over-the-counter Preparation H, avoid straining and limit long peers on toilet.  If constipation worsens or becomes refractory to laxatives she should let me know.  Recently having more belching, heartburn, regurgitation, PCP gave her omeprazole 20 mg with that she was told not to take this every day therefore she is only taking it as needed.  Having some vomiting at times.  Denies any melena.  She has taken ibuprofen 800 mg daily but is only been on this for about 2 weeks for recent shoulder injury.  Low suspicion for peptic ulcer disease, could be GERD flare.  Recommend she take omeprazole 20 mg daily, will add Carafate 1 g 4 times daily, however if symptoms are not improving she should make me aware and we will need to proceed with upper endoscopy further evaluation of her symptoms.    PLAN:  Take omeprazole 20mg  daily   2. Carafate 1g QID  3. Continue with laxative  4. Proceed with EGD if symptoms not improving with omeprazole and carafate  5. Preparation H for hemorrhoids  All questions were answered, patient verbalized understanding and is in agreement with plan as outlined above.    Follow Up: 6 weeks   [name] L. Jeanmarie Hubert, MSN, APRN, AGNP-C Adult-Gerontology Nurse Practitioner [facility] for GI [facility]  I have reviewed the note and agree with the APP's assessment as described in this progress note  Agree with possible  esophagogastroduodenospy if symptoms persist.  Ultimately, if investigations are negative we will need to proceed with gastric emptying study as we could be dealing with GLP-1 induced gastroparesis.  Katrinka Blazing, MD Gastroenterology and Hepatology [facility] Gastroenterology

## 2023-09-11 ENCOUNTER — Telehealth (HOSPITAL_COMMUNITY): Payer: Medicaid Other | Admitting: Psychiatry

## 2023-09-17 ENCOUNTER — Encounter (INDEPENDENT_AMBULATORY_CARE_PROVIDER_SITE_OTHER): Payer: Self-pay | Admitting: Gastroenterology

## 2023-09-17 ENCOUNTER — Ambulatory Visit (INDEPENDENT_AMBULATORY_CARE_PROVIDER_SITE_OTHER): Payer: Medicaid Other | Admitting: Gastroenterology

## 2023-09-17 VITALS — BP 114/76 | HR 79 | Temp 98.5°F | Ht 61.5 in | Wt 200.1 lb

## 2023-09-17 DIAGNOSIS — K5900 Constipation, unspecified: Secondary | ICD-10-CM

## 2023-09-17 DIAGNOSIS — K219 Gastro-esophageal reflux disease without esophagitis: Secondary | ICD-10-CM

## 2023-09-17 MED ORDER — OMEPRAZOLE 20 MG PO CPDR: 20.0000 mg | DELAYED_RELEASE_CAPSULE | Freq: Every day | ORAL | 3 refills | Status: AC

## 2023-09-17 NOTE — Progress Notes (Signed)
Referring Provider: Ignatius Specking, MD Primary Care Physician:  Ignatius Specking, MD Primary GI Physician: Dr. Levon Hedger   Chief Complaint  Patient presents with   Constipation    Follow up on constipation. States right now she has been going regular. If she does not go regularly she will use a laxative.    Gastroesophageal Reflux    Follow up on GERD. Takes omeprazole every day. Will have symptoms if she skips a day.    HPI:   [name] is a 48 y.o. female with past medical history of bipolar 1 disorder, depression, constipation and GERD   Patient presenting today for follow up of constipation and GERD  Last seen December 2024, at that time patient reported more belching and abdominal upset over the past few months.  PCP started her on omeprazole 20 mg which she is only taking on occasion.  Did have an episode of vomiting.  Recently started on Zoloft and BuSpar and on semaglutide for about 1 year.  Taking ibuprofen 8 or milligrams daily for the past couple weeks.  She had stopped Amitiza as her hair is falling out from this.  Taking a laxative 1 time per week and feeling she is pretty cleaned out with this.  Usually having a bowel movement once per day with some ongoing rectal bleeding but no pain.  Patient reminded to take omeprazole 20 mg daily, Carafate 1 g 4 times daily, continue with laxative proceed with EGD if symptoms not improving with omeprazole and Carafate, Preparation H for hemorrhoids and may consider gastric emptying study if EGD negative to rule out GLP-1 induced gastroparesis  Present: Feeling much better since doing course of carafate and taking omeprazole 20mg  daily. Only has breakthrough if she misses omeprazole with belching. No nausea or vomiting.   Feels constipation is not an issue currently. She typically is going to the restroom daily and having a BM. Occasionally will take a laxative if she does not feel cleaned out she will do a laxative. Denies rectal  bleeding or melena. Denies any other medication changes   She is off of semaglutide now, switched to wegovy about 1 week ago. Feels that appetite is curbed well on wegovy, requires less snacking.    Last Colonoscopy: 02/2022 examined ileum normal - One 4 mm polyp in the sigmoid colon, removed with a cold snare. Tubular adenoma - Diverticulosis in the sigmoid colon. - Non-bleeding internal hemorrhoids. Last Endoscopy: never    Recommendations:  Repeat in 7 years   Past Medical History:  Diagnosis Date   Back complaints    Bipolar 1 disorder (HCC)    Depression     Past Surgical History:  Procedure Laterality Date   ABDOMINAL HYSTERECTOMY     CARPAL TUNNEL RELEASE Right 2007   CHOLECYSTECTOMY     COLONOSCOPY WITH PROPOFOL N/A 03/16/2022   Procedure: COLONOSCOPY WITH PROPOFOL;  Surgeon: Dolores Frame, MD;  [facility];  Service: Gastroenterology;  Laterality: N/A;  1245 ASA 1   GALLBLADDER SURGERY     OVARIAN CYST REMOVAL     PARTIAL HYSTERECTOMY     POLYPECTOMY  03/16/2022   Procedure: POLYPECTOMY;  Surgeon: Dolores Frame, MD;  [facility];  Service: Gastroenterology;;   TUBAL LIGATION      Current Outpatient Medications  Medication Sig Dispense Refill   Acetaminophen (TYLENOL ARTHRITIS PAIN PO) Take 650 mg by mouth as needed.     atorvastatin (LIPITOR) 20 MG tablet  Take 1 tablet (20 mg total) by mouth daily. 30 tablet 1   busPIRone (BUSPAR) 10 MG tablet Take 1 tablet (10 mg total) by mouth 3 (three) times daily. 90 tablet 2   nitroGLYCERIN (NITROSTAT) 0.4 MG SL tablet Place 1 tablet (0.4 mg total) under the tongue every 5 (five) minutes x 3 doses as needed for chest pain (if no relief after 3rd dose, proceed to ED or call 911). 25 tablet 3   omeprazole (PRILOSEC) 20 MG capsule Take 20 mg by mouth daily.     risperiDONE (RISPERDAL) 0.25 MG tablet Take 1 tablet (0.25 mg total) by mouth at bedtime. 30 tablet 1   sertraline (ZOLOFT)  100 MG tablet Take 1 tablet (100 mg total) by mouth daily. 30 tablet 2   sucralfate (CARAFATE) 1 GM/10ML suspension Take 10 mLs (1 g total) by mouth 4 (four) times daily. 420 mL 1   WEGOVY 2.4 MG/0.75ML SOAJ Inject 2.4 mg into the skin. Once weekly     indomethacin (INDOCIN) 25 MG capsule Take 1 capsule (25 mg total) by mouth 2 (two) times daily with a meal. (Patient not taking: Reported on 09/17/2023) 60 capsule 0   No current facility-administered medications for this visit.    Allergies as of 09/17/2023 - Review Complete 09/17/2023  Allergen Reaction Noted   Cortisone Swelling and Rash 05/24/2021    Family History  Problem Relation Age of Onset   Cancer Mother    Kidney cancer Mother    Hypertension Mother    Diabetes Mother    Heart disease Father    Hypertension Father    Prostate cancer Father    Breast cancer Neg Hx     Social History   Socioeconomic History   Marital status: Married    Spouse name: Hydrologist   Number of children: 4   Years of education: Not on file   Highest education level: 9th grade  Occupational History   Not on file  Tobacco Use   Smoking status: Former    Current packs/day: 0.00    Types: Cigarettes    Quit date: 05/22/2015    Years since quitting: 8.3    Passive exposure: Past   Smokeless tobacco: Never  Vaping Use   Vaping status: Former  Substance and Sexual Activity   Alcohol use: Not Currently    Comment: history social etoh 1 glass of wine every other month   Drug use: Yes    Types: Marijuana    Comment: MJ daily as edibles.  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   Sexual activity: Not on file  Other Topics Concern   Not on file  Social History Narrative   Not on file   Social Drivers of Health   Financial Resource Strain: Not on file  Food Insecurity: Not on file  Transportation Needs: Not on file  Physical Activity: Not on file  Stress: Not on file  Social Connections: Not on file    Review of systems General: negative for malaise,  night sweats, fever, chills, weight loss Neck: Negative for lumps, goiter, pain and significant neck swelling Resp: Negative for cough, wheezing, dyspnea at rest CV: Negative for chest pain, leg swelling, palpitations, orthopnea GI: denies melena, hematochezia, nausea, vomiting, diarrhea, constipation, dysphagia, odyonophagia, early satiety or unintentional weight loss.  The remainder of the review of systems is noncontributory.  Physical Exam: BP 114/76   Pulse 79   Temp 98.5 F (36.9 C) (Oral)   Ht 5' 1.5" (1.562 m)  Wt 200 lb 1.6 oz (90.8 kg)   BMI 37.20 kg/m  General:   Alert and oriented. No distress noted. Pleasant and cooperative.  Head:  Normocephalic and atraumatic. Eyes:  Conjuctiva clear without scleral icterus. Mouth:  Oral mucosa pink and moist. Good dentition. No lesions. Heart: Normal rate and rhythm, s1 and s2 heart sounds present.  Lungs: Clear lung sounds in all lobes. Respirations equal and unlabored. Abdomen:  +BS, soft, non-tender and non-distended. No rebound or guarding. No HSM or masses noted. Neurologic:  Alert and  oriented x4 Psych:  Alert and cooperative. Normal mood and affect.  Invalid input(s): "6 MONTHS"   ASSESSMENT: [name] is a 48 y.o. female presenting today for follow up of GERD and Constipation  GERD: Feeling better on omeprazole 20 mg daily and after completion of Carafate course.  She only has breakthrough symptoms if she misses her PPI dose.  She denies any nausea, vomiting, abdominal pain.  Appetite is decreased some on Wegovy but overall she feels good.  Will continue on omeprazole 20 mg daily for now.  Constipation: Not taking anything currently, reports having 1 bowel movement usually daily, will take a laxative if she skips a few days though overall feels like bowels are moving fairly well.  Encourage patient to increase water intake, increase fruits, veggies, whole grains in her diet.   PLAN:  -continue omeprazole 20mg   daily -Increase water intake, aim for atleast 64 oz per day -Increase fruits, veggies and whole grains, kiwi and prunes are especially good for constipation  All questions were answered, patient verbalized understanding and is in agreement with plan as outlined above.   Follow Up: 1 year  Ellsie Violette L. Jeanmarie Hubert, MSN, APRN, AGNP-C Adult-Gerontology Nurse Practitioner [facility] for [facility]  I have reviewed the note and agree with the APP's assessment as described in this progress note  Katrinka Blazing, MD Gastroenterology and Hepatology [facility] Gastroenterology

## 2023-09-17 NOTE — Patient Instructions (Signed)
-  continue omeprazole 20mg  daily -Increase water intake, aim for atleast 64 oz per day -Increase fruits, veggies and whole grains, kiwi and prunes are especially good for constipation  Please let me know if you have any worsening GI issues, otherwise we will see you in 1 year  It was a pleasure to see you today. I want to create trusting relationships with patients and provide genuine, compassionate, and quality care. I truly value your feedback! please be on the lookout for a survey regarding your visit with me today. I appreciate your input about our visit and your time in completing this!    Malak Orantes L. Jeanmarie Hubert, MSN, APRN, AGNP-C Adult-Gerontology Nurse Practitioner [facility] at [facility]

## 2023-09-20 ENCOUNTER — Telehealth (HOSPITAL_COMMUNITY): Payer: Medicaid Other | Admitting: Psychiatry

## 2023-09-20 ENCOUNTER — Encounter (HOSPITAL_COMMUNITY): Payer: Self-pay | Admitting: Psychiatry

## 2023-09-20 DIAGNOSIS — F5021 Bulimia nervosa, mild: Secondary | ICD-10-CM

## 2023-09-20 DIAGNOSIS — F603 Borderline personality disorder: Secondary | ICD-10-CM | POA: Diagnosis not present

## 2023-09-20 DIAGNOSIS — F4310 Post-traumatic stress disorder, unspecified: Secondary | ICD-10-CM | POA: Insufficient documentation

## 2023-09-20 DIAGNOSIS — F4001 Agoraphobia with panic disorder: Secondary | ICD-10-CM | POA: Diagnosis not present

## 2023-09-20 DIAGNOSIS — F411 Generalized anxiety disorder: Secondary | ICD-10-CM

## 2023-09-20 DIAGNOSIS — G4733 Obstructive sleep apnea (adult) (pediatric): Secondary | ICD-10-CM

## 2023-09-20 MED ORDER — SERTRALINE HCL 100 MG PO TABS: 150.0000 mg | ORAL_TABLET | Freq: Every day | ORAL | 2 refills | Status: DC

## 2023-09-20 NOTE — Patient Instructions (Addendum)
We increased the sertraline (Zoloft) to 150 mg once daily today.  This should begin to help with the anxiety and depression a bit more.  Do your best to be consistent with meals and I am sure once you can meet with the nutritionist they will have a lot of helpful tips.  Try switching the sweet tea to decaf and that should help with sleep.
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
## 2023-10-04 ENCOUNTER — Other Ambulatory Visit (HOSPITAL_COMMUNITY): Payer: Self-pay | Admitting: Psychiatry

## 2023-10-04 DIAGNOSIS — F5021 Bulimia nervosa, mild: Secondary | ICD-10-CM

## 2023-10-04 DIAGNOSIS — F603 Borderline personality disorder: Secondary | ICD-10-CM

## 2023-10-04 DIAGNOSIS — Z8659 Personal history of other mental and behavioral disorders: Secondary | ICD-10-CM

## 2023-10-04 DIAGNOSIS — X779XXA Intentional self-harm by unspecified hot objects, initial encounter: Secondary | ICD-10-CM

## 2023-10-22 ENCOUNTER — Telehealth (HOSPITAL_COMMUNITY): Payer: Medicaid Other | Admitting: Psychiatry

## 2023-10-22 ENCOUNTER — Encounter (HOSPITAL_COMMUNITY): Payer: Self-pay | Admitting: Psychiatry

## 2023-10-22 DIAGNOSIS — F5021 Bulimia nervosa, mild: Secondary | ICD-10-CM | POA: Diagnosis not present

## 2023-10-22 DIAGNOSIS — F603 Borderline personality disorder: Secondary | ICD-10-CM | POA: Diagnosis not present

## 2023-10-22 DIAGNOSIS — Z8659 Personal history of other mental and behavioral disorders: Secondary | ICD-10-CM

## 2023-10-22 DIAGNOSIS — F411 Generalized anxiety disorder: Secondary | ICD-10-CM

## 2023-10-22 DIAGNOSIS — X779XXA Intentional self-harm by unspecified hot objects, initial encounter: Secondary | ICD-10-CM

## 2023-10-22 DIAGNOSIS — F1220 Cannabis dependence, uncomplicated: Secondary | ICD-10-CM | POA: Diagnosis not present

## 2023-10-22 DIAGNOSIS — F4001 Agoraphobia with panic disorder: Secondary | ICD-10-CM

## 2023-10-22 MED ORDER — RISPERIDONE 0.25 MG PO TABS
0.2500 mg | ORAL_TABLET | Freq: Two times a day (BID) | ORAL | 1 refills | Status: AC
Start: 2023-10-22 — End: ?

## 2023-10-22 MED ORDER — SERTRALINE HCL 100 MG PO TABS
150.0000 mg | ORAL_TABLET | Freq: Every day | ORAL | 2 refills | Status: AC
Start: 2023-10-22 — End: ?
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
## 2023-10-22 NOTE — Patient Instructions (Signed)
 We increased the risperidone (Risperdal) to 0.25 mg twice daily.  Hopefully this will assist with the ongoing worsening to concentration and anxiety/irritability from the marijuana use and please cut back on this as you are able.  Please coordinate with your PCP to get a nutrition referral, vitamin D, B12, folate, iron panel, blind weights, orthostatic vital signs.  We will also need to get an updated lipid panel, A1c, EKG while you are on the Risperdal.

## 2023-11-19 ENCOUNTER — Telehealth (HOSPITAL_COMMUNITY): Admitting: Psychiatry

## 2023-11-21 IMAGING — MG DIGITAL DIAGNOSTIC BILAT W/ TOMO W/ CAD
6 of 10 series · 6 of 30 positions shown · non-contrast
Comparison: Previous exams.

CLINICAL DATA: 45-year-old female with a palpable area of concern
in the inner left breast. The patient states she does frequently
squeeze this area and it does get bigger and smaller and sometimes
painful.

EXAM:
DIGITAL DIAGNOSTIC BILATERAL MAMMOGRAM WITH TOMOSYNTHESIS AND CAD;
ULTRASOUND LEFT BREAST LIMITED
TECHNIQUE: Bilateral digital diagnostic mammography and breast tomosynthesis
was performed. The images were evaluated with computer-aided
detection.; Targeted ultrasound examination of the left breast was
performed.

[L MLO synth-2D]
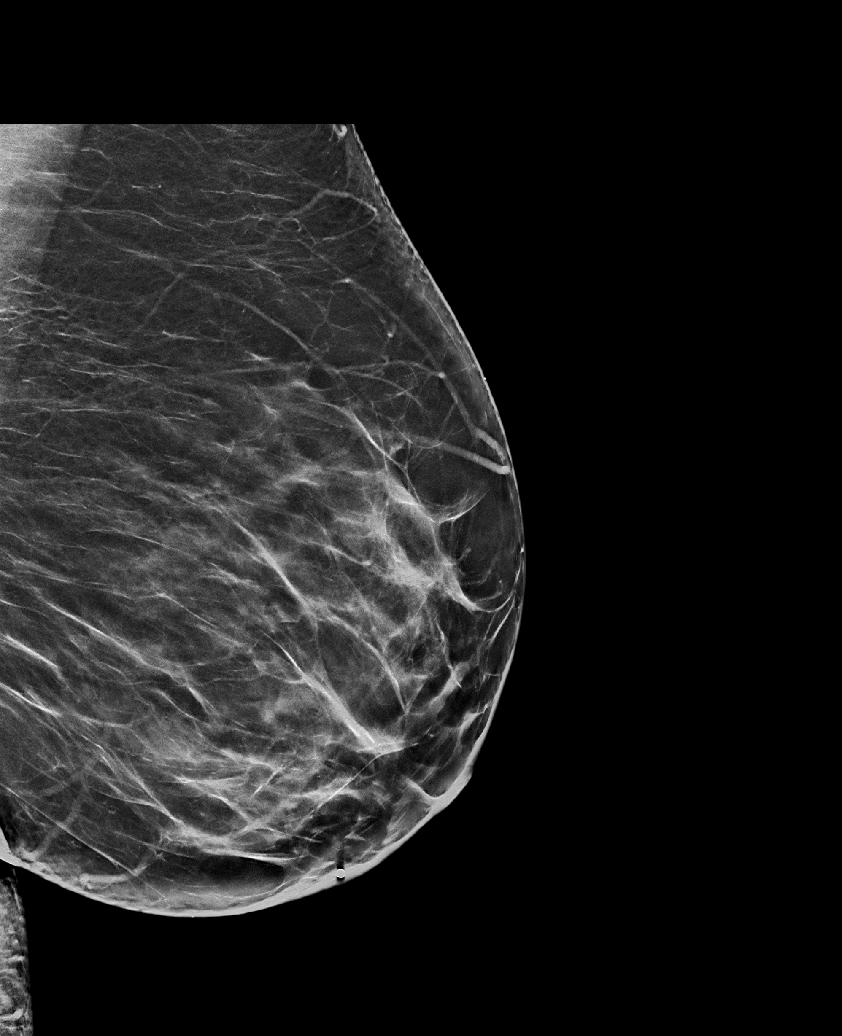

[L CC synth-2D]
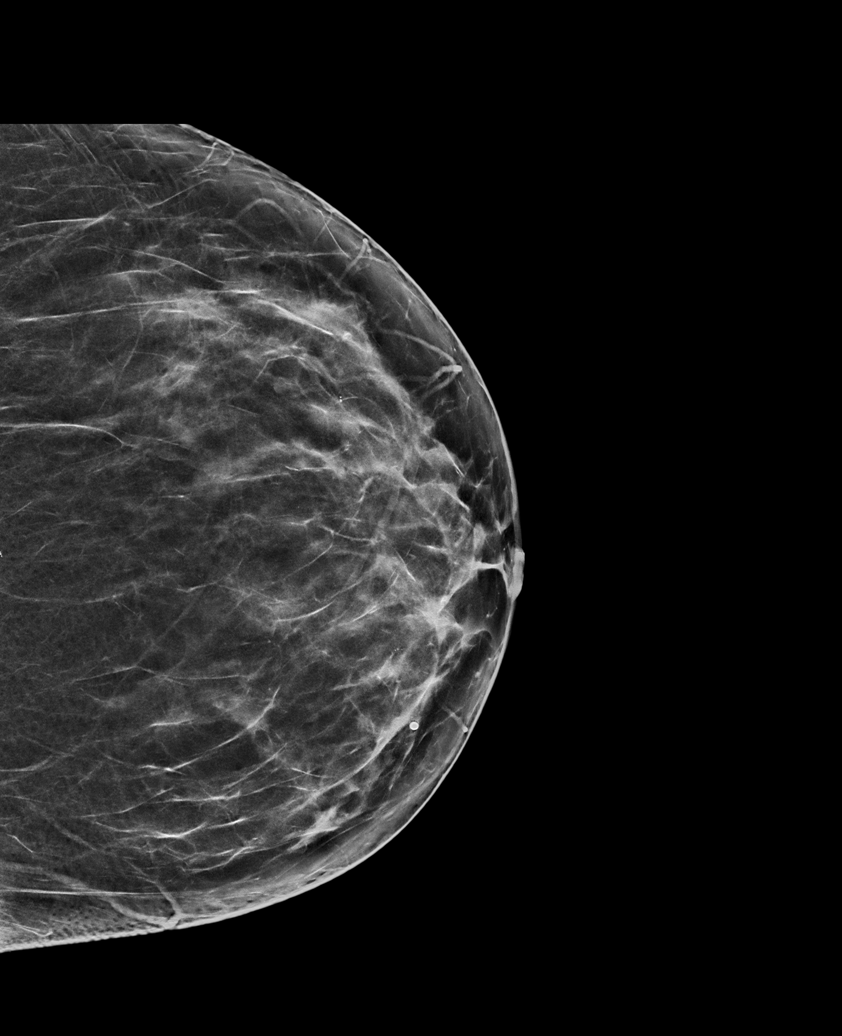

[R MLO synth-2D]
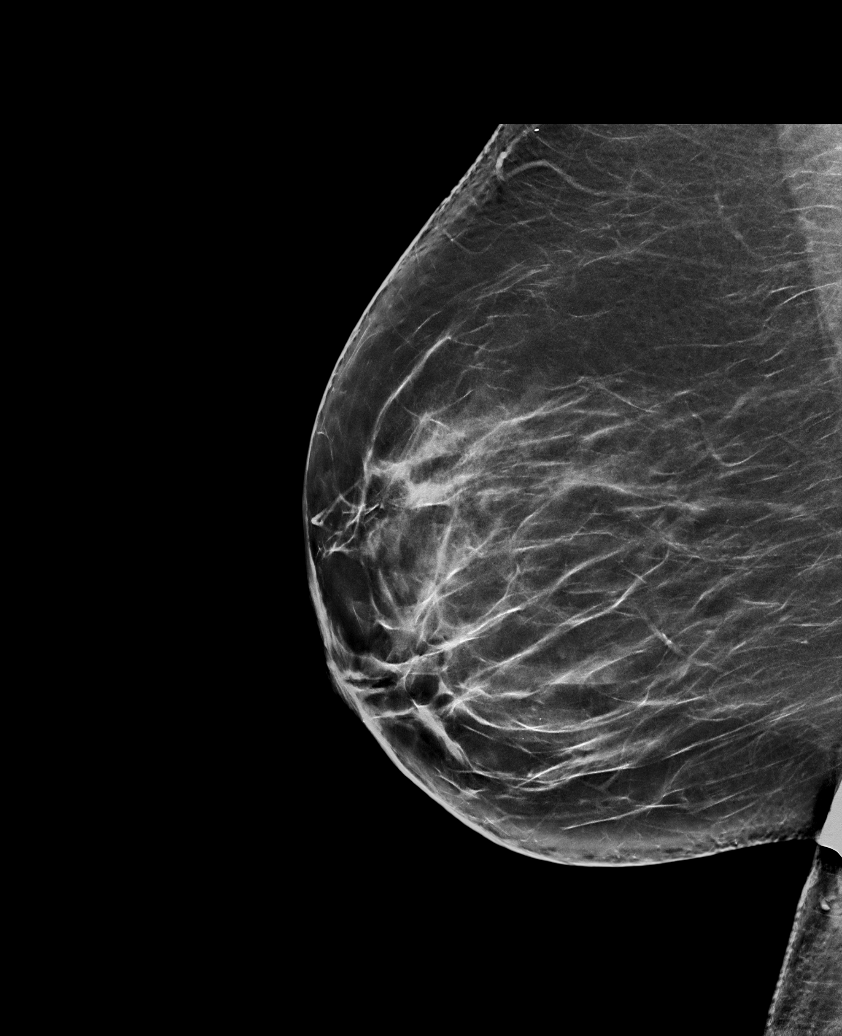

[R CC synth-2D]
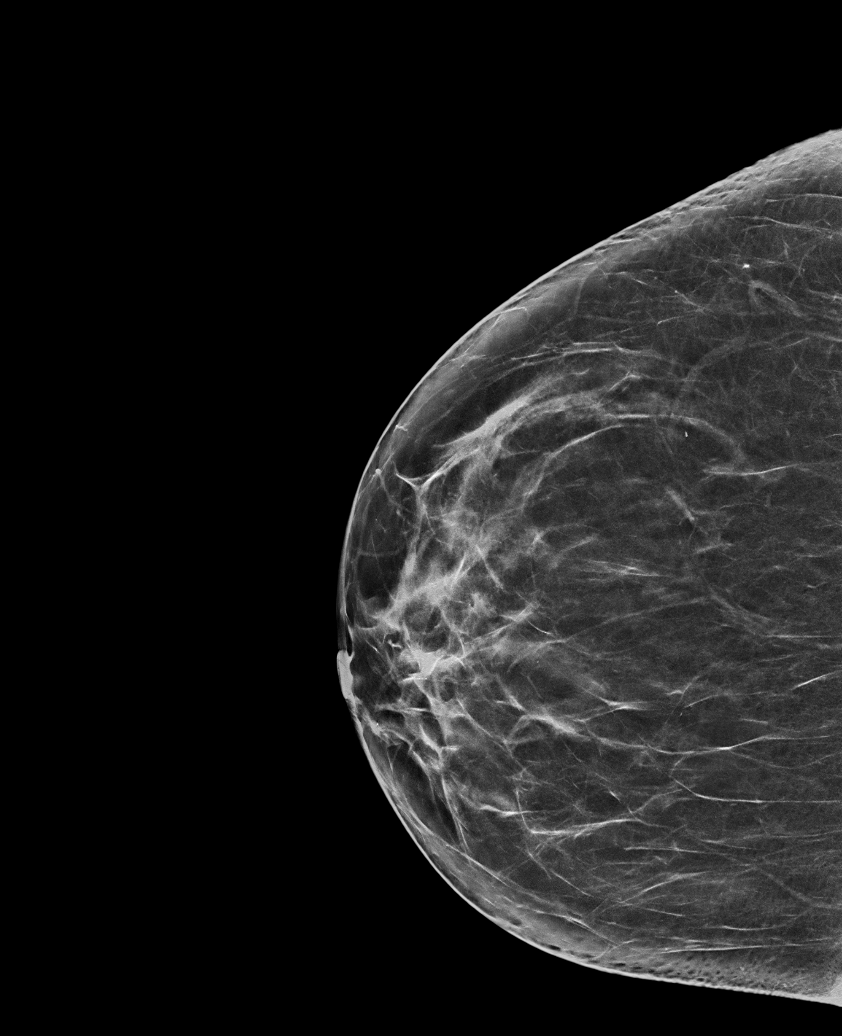

[L TAN synth-2D]
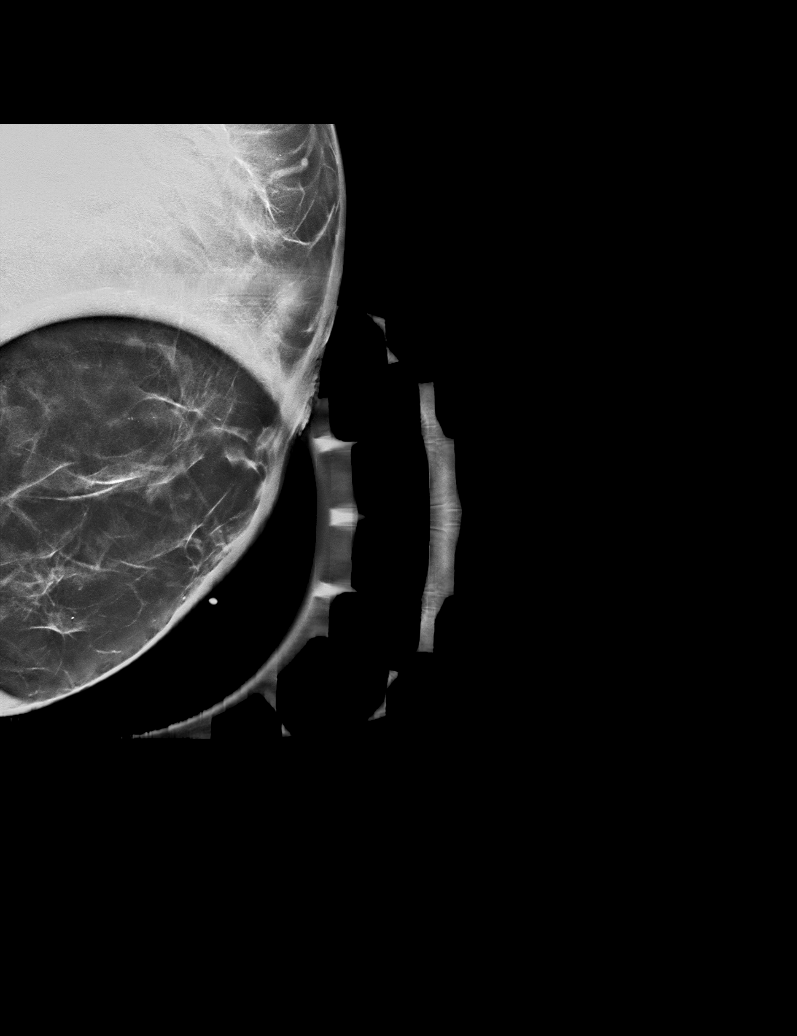

[R MLO tomo · tomo slice 42/83.0]
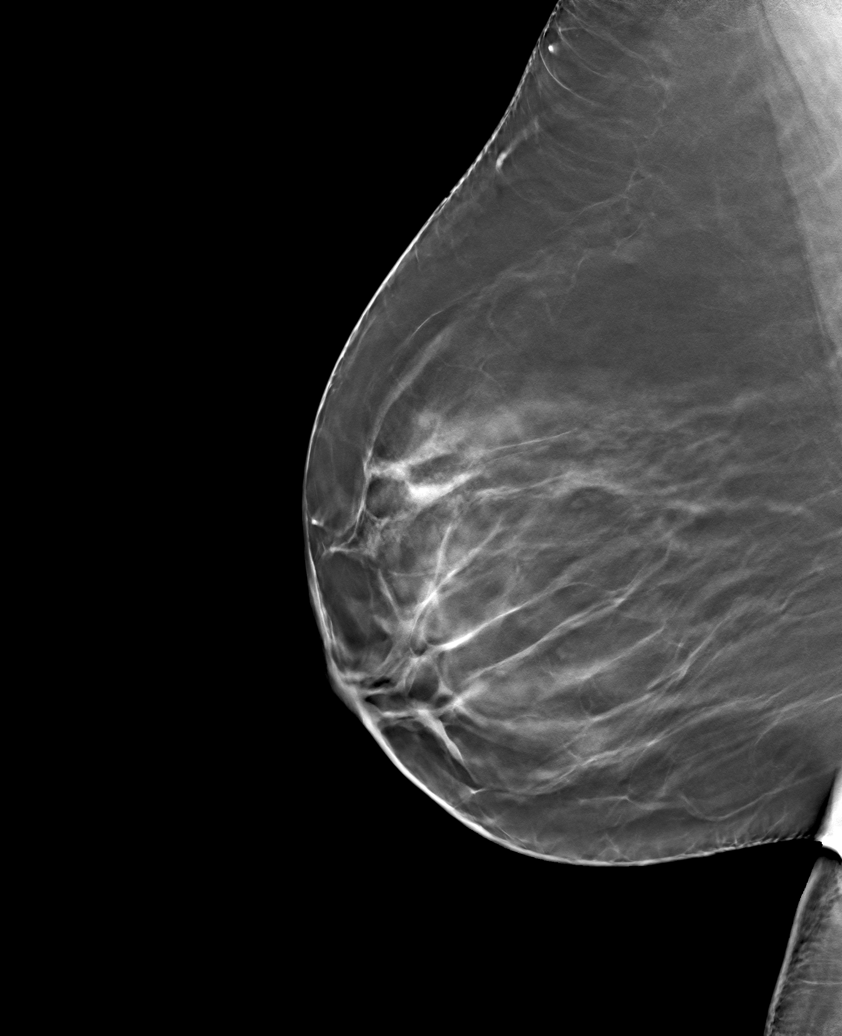

[6 of 30 positions shown; findings below may reference images not displayed]

ACR Breast Density Category b: There are scattered areas of
fibroglandular density.
FINDINGS: No suspicious masses or calcifications seen in the right breast.
Spot compression tangential tomograms were performed over the
palpable area of concern in the left breast demonstrating mild focal
skin thickening in this location.

Physical examination reveals a firm area of thickening involving the
skin of the inner left breast with mild associated erythema.

Targeted ultrasound of the left breast was performed. There is an
oval hypoechoic mass within the skin of the left breast at 9 o'clock
3 cm from nipple measuring 0.9 cm. There is a thin linear tract that
does extend to the skin with findings most compatible with a
sebaceous cyst.
IMPRESSION: 1. Sebaceous cyst at site of palpable concern in the inner left
breast.

* 2.  No findings of malignancy in either breast.

RECOMMENDATION:
1. Recommend further management of the left breast sebaceous cyst be
based on clinical assessment.

2.  Screening mammogram in one year.(Code:L2-K-I51)

I have discussed the findings and recommendations with the patient.
If applicable, a reminder letter will be sent to the patient
regarding the next appointment.

BI-RADS CATEGORY  2: Benign.

## 2024-03-24 ENCOUNTER — Other Ambulatory Visit: Payer: Self-pay | Admitting: Internal Medicine

## 2024-03-24 DIAGNOSIS — Z1231 Encounter for screening mammogram for malignant neoplasm of breast: Secondary | ICD-10-CM

## 2024-03-25 ENCOUNTER — Ambulatory Visit
Admission: RE | Admit: 2024-03-25 | Discharge: 2024-03-25 | Disposition: A | Discharge status: Home / Self Care | Source: Ambulatory Visit | Attending: Internal Medicine | Admitting: Internal Medicine

## 2024-03-25 DIAGNOSIS — Z1231 Encounter for screening mammogram for malignant neoplasm of breast: Secondary | ICD-10-CM

## 2024-04-02 IMAGING — CT CT ABD-PELV W/ CM
2 of 4 series · 16 of 46 positions shown, 18 images · IV contrast (Omnipaque or Isovue)
Comparison: None Available.

CLINICAL DATA: Right lower quadrant and right flank pain for 5
days.

EXAM:
CT ABDOMEN AND PELVIS WITH CONTRAST
TECHNIQUE: Multidetector CT imaging of the abdomen and pelvis was performed
using the standard protocol following bolus administration of
intravenous contrast.

[Series 2: axial st · axial · 0.85mm/px · z∈[+975,+1380]mm · 13 of 91 slices shown, 15 images]
[im 5/91  soft-tissue]
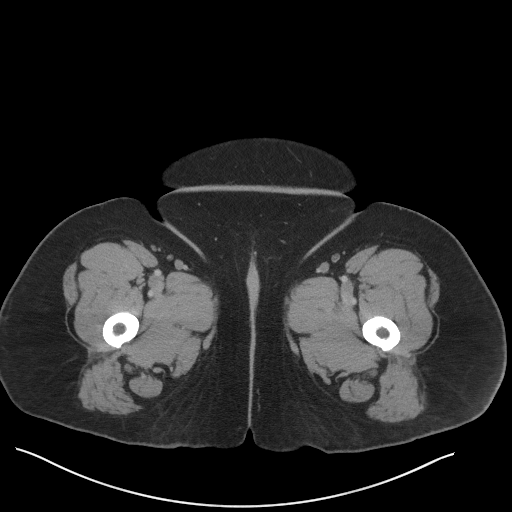
[im 5/91  bone]
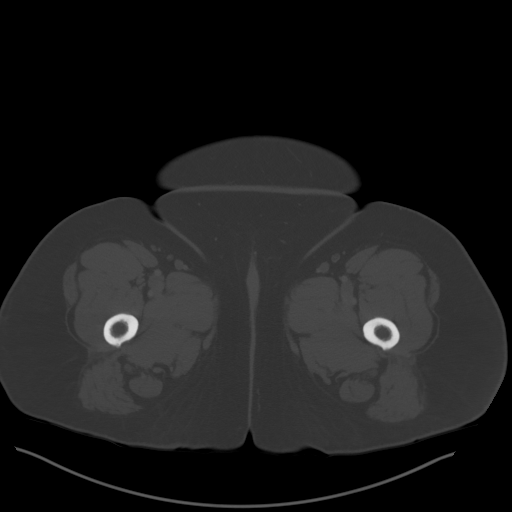
[im 13/91  soft-tissue]
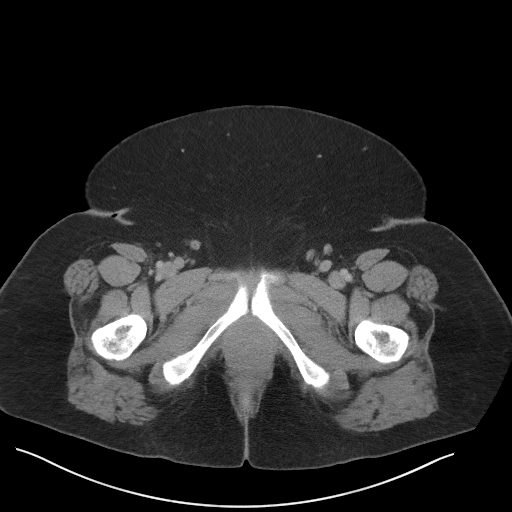
[im 21/91  soft-tissue]
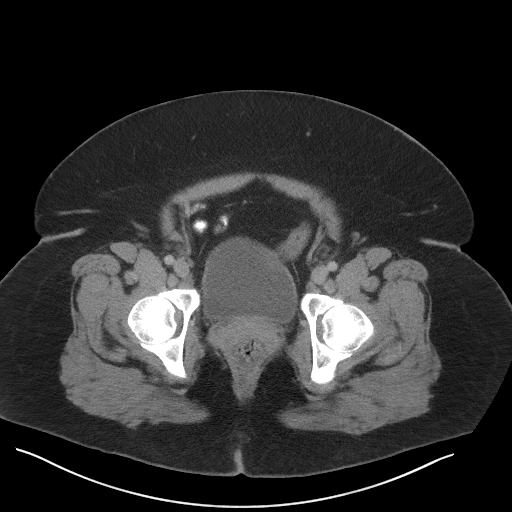
[im 25/91  soft-tissue]
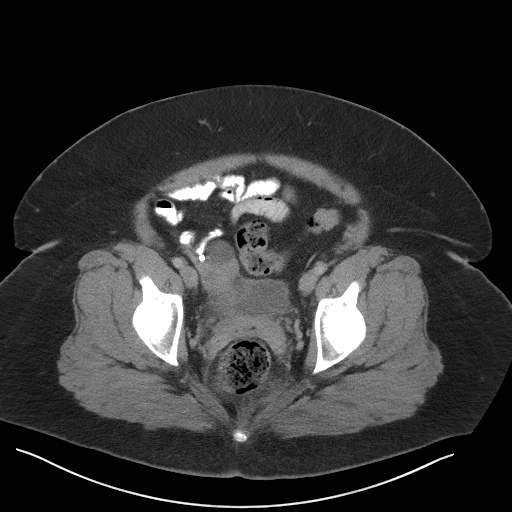
[im 33/91  soft-tissue]
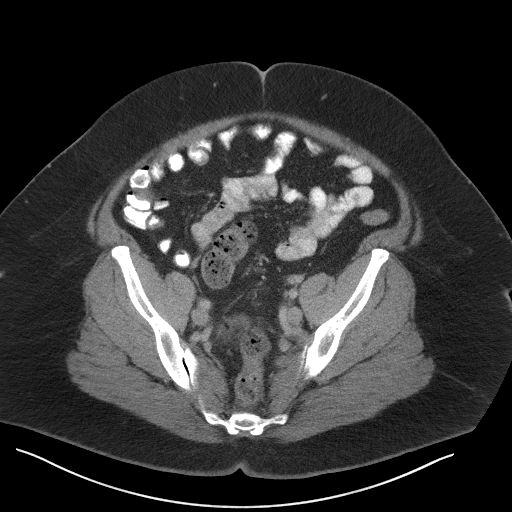
[im 37/91  soft-tissue]
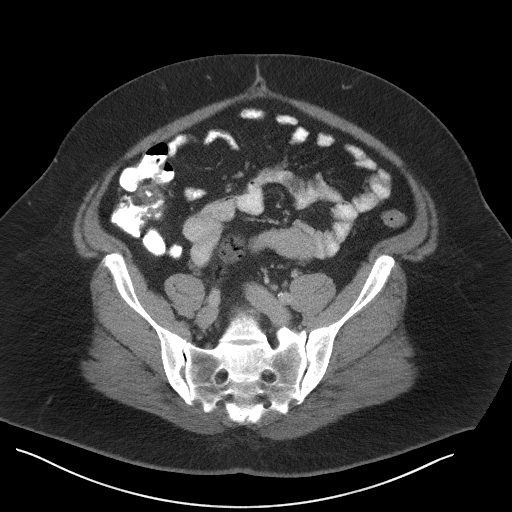
[im 46/91  soft-tissue]
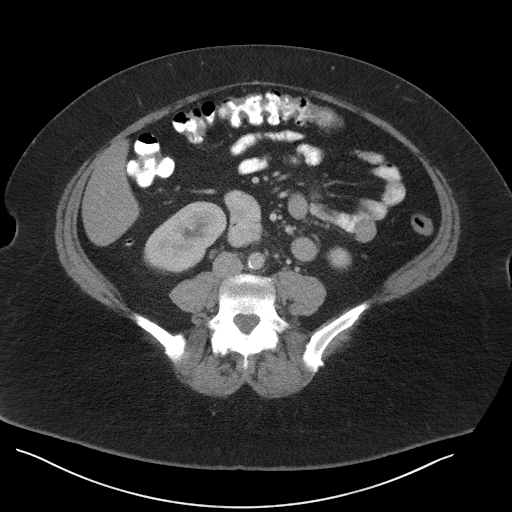
[im 54/91  soft-tissue]
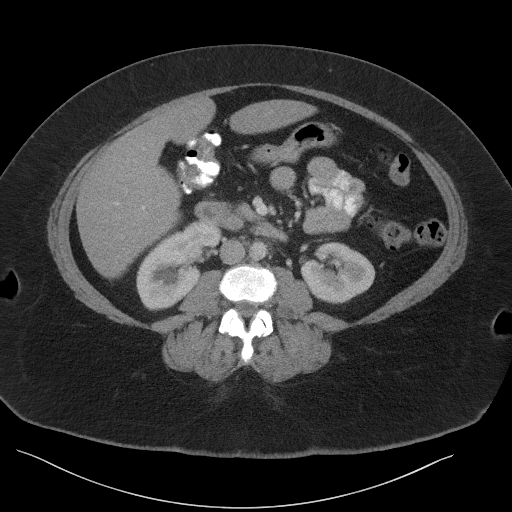
[im 58/91  soft-tissue]
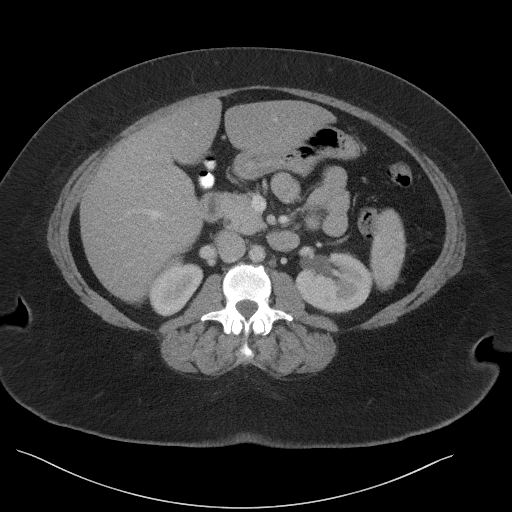
[im 58/91  bone]
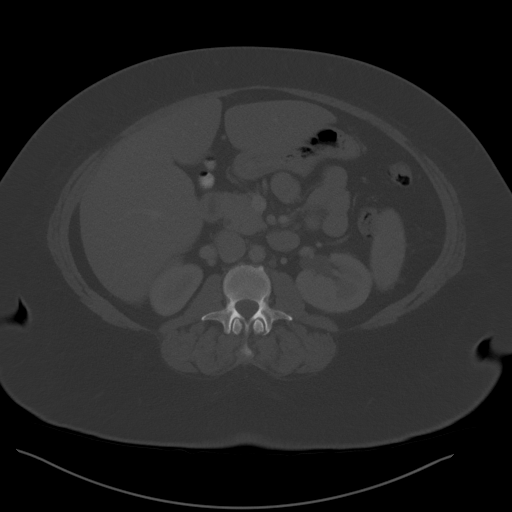
[im 66/91  soft-tissue]
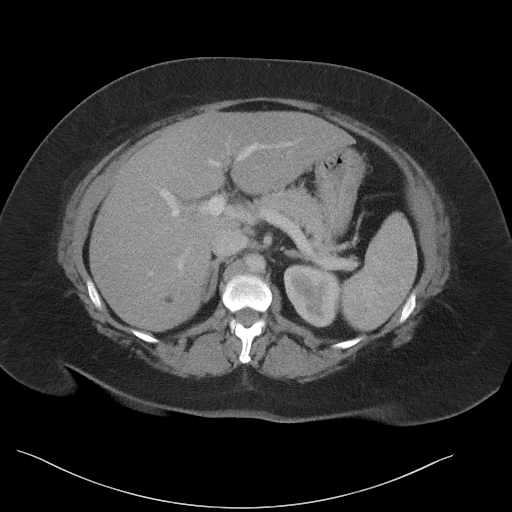
[im 70/91  soft-tissue]
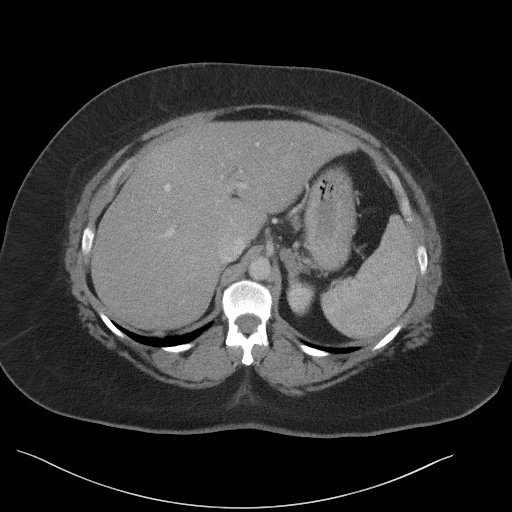
[im 78/91  soft-tissue]
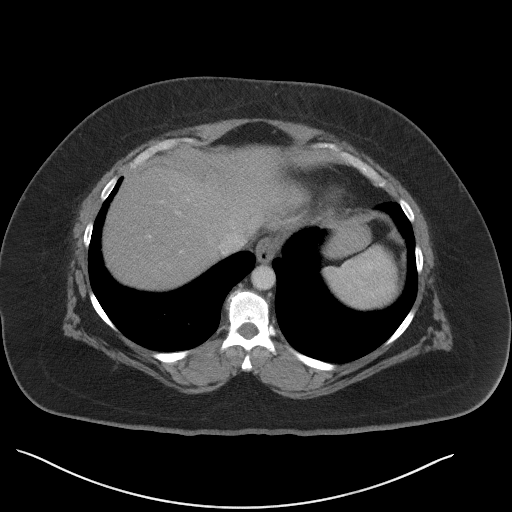
[im 86/91  soft-tissue]
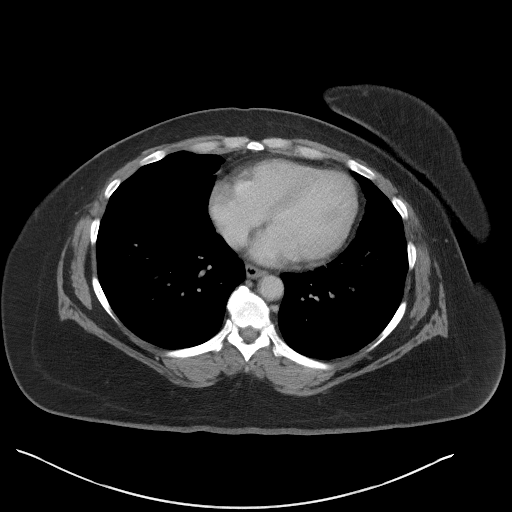

[Series 5: coronal st · coronal · 0.79mm/px · 3 of 117 slices shown]
[im 39/117  soft-tissue]
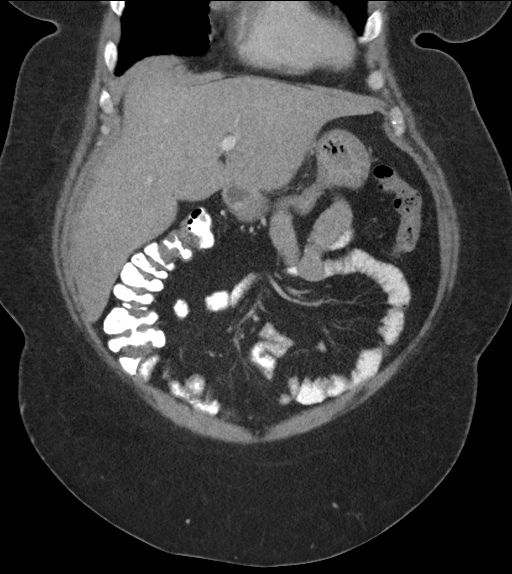
[im 52/117  soft-tissue]
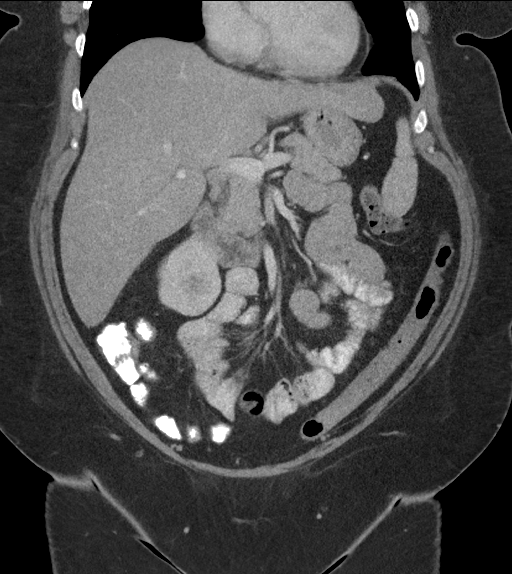
[im 65/117  soft-tissue]
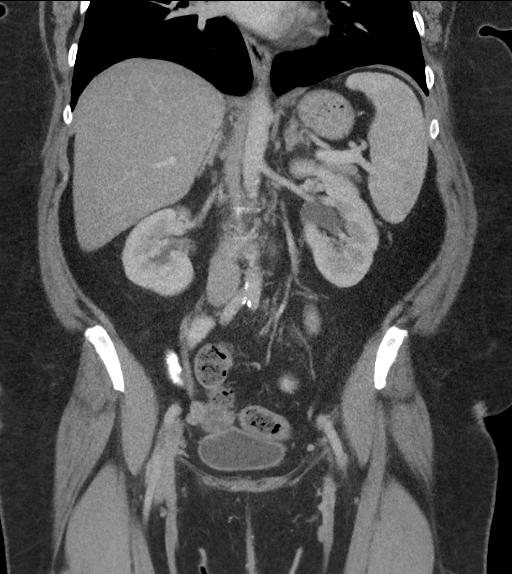

[16 of 46 positions shown; findings below may reference images not displayed]

RADIATION DOSE REDUCTION: This exam was performed according to the
departmental dose-optimization program which includes automated
exposure control, adjustment of the mA and/or kV according to
patient size and/or use of iterative reconstruction technique.

CONTRAST:  100mL OMNIPAQUE IOHEXOL 300 MG/ML  SOLN
FINDINGS: Lower chest: No significant pulmonary nodules or acute consolidative
airspace disease.

Hepatobiliary: Normal liver size. Two scattered subcentimeter
hypodense liver lesions are too small to characterize.
Cholecystectomy. No biliary ductal dilatation.

Pancreas: Normal, with no mass or duct dilation.

Spleen: Normal size. No mass.

Adrenals/Urinary Tract: Normal right adrenal. Left adrenal 2.0 cm
nodule with density 36 HU. No contour deforming renal masses. No
hydronephrosis. Symmetric normal contrast nephrograms. Normal
bladder.

Stomach/Bowel: Normal non-distended stomach. Normal caliber small
bowel with no small bowel wall thickening. Normal appendix. Oral
contrast transits to the transverse colon. Normal large bowel with
no diverticulosis, large bowel wall thickening or pericolonic fat
stranding.

Vascular/Lymphatic: Atherosclerotic nonaneurysmal abdominal aorta.
Patent portal, splenic and renal veins. No pathologically enlarged
lymph nodes in the abdomen or pelvis.

Reproductive: Status post hysterectomy, with no abnormal findings at
the vaginal cuff. Simple 2.1 cm right adnexal cyst (series 2/image
67). No left adnexal mass.

Other: No pneumoperitoneum, ascites or focal fluid collection.

Musculoskeletal: No aggressive appearing focal osseous lesions. Mild
thoracolumbar spondylosis.
IMPRESSION: 1. No acute abnormality. No evidence of bowel obstruction or acute
bowel inflammation. Normal appendix.
2. Indeterminate 2.0 cm left adrenal nodule. In the absence of a
history of malignancy, a lipid poor adenoma is presumed. Follow-up
adrenal protocol CT abdomen without and with IV contrast may be
considered in 12 months. This recommendation follows ACR consensus
guidelines: Management of Incidental Adrenal Masses: A White Paper
of the ACR Incidental Findings Committee. [HOSPITAL]
4500;14:1058-1022.
3. Simple 2.1 cm right adnexal cyst. No follow-up imaging is
recommended. Reference: JACR [DATE]):[id number].
4. Aortic Atherosclerosis ([id number]).

## 2024-04-11 ENCOUNTER — Encounter: Payer: Self-pay | Admitting: Radiology

## 2024-06-23 ENCOUNTER — Encounter: Payer: Self-pay | Admitting: Radiology
